# Patient Record
Sex: Female | Born: 1935 | Race: White | Hispanic: No | Marital: Single
Health system: Southern US, Community
[De-identification: ages and names within clinical notes are randomized; demographics above are authoritative.]

## PROBLEM LIST (undated history)

## (undated) DIAGNOSIS — G309 Alzheimer's disease, unspecified: Secondary | ICD-10-CM

## (undated) DIAGNOSIS — J189 Pneumonia, unspecified organism: Secondary | ICD-10-CM

## (undated) DIAGNOSIS — F410 Panic disorder [episodic paroxysmal anxiety] without agoraphobia: Secondary | ICD-10-CM

## (undated) DIAGNOSIS — I350 Nonrheumatic aortic (valve) stenosis: Secondary | ICD-10-CM

## (undated) DIAGNOSIS — F0280 Dementia in other diseases classified elsewhere without behavioral disturbance: Secondary | ICD-10-CM

---

## 2011-05-19 DIAGNOSIS — I10 Essential (primary) hypertension: Secondary | ICD-10-CM | POA: Diagnosis not present

## 2011-05-19 DIAGNOSIS — E785 Hyperlipidemia, unspecified: Secondary | ICD-10-CM | POA: Diagnosis not present

## 2011-05-23 DIAGNOSIS — K449 Diaphragmatic hernia without obstruction or gangrene: Secondary | ICD-10-CM | POA: Diagnosis not present

## 2011-05-23 DIAGNOSIS — R109 Unspecified abdominal pain: Secondary | ICD-10-CM | POA: Diagnosis not present

## 2011-05-23 DIAGNOSIS — M5430 Sciatica, unspecified side: Secondary | ICD-10-CM | POA: Diagnosis not present

## 2011-05-23 DIAGNOSIS — J189 Pneumonia, unspecified organism: Secondary | ICD-10-CM | POA: Diagnosis not present

## 2011-05-23 DIAGNOSIS — M5137 Other intervertebral disc degeneration, lumbosacral region: Secondary | ICD-10-CM | POA: Diagnosis not present

## 2011-05-23 DIAGNOSIS — R059 Cough, unspecified: Secondary | ICD-10-CM | POA: Diagnosis not present

## 2011-05-23 DIAGNOSIS — Z981 Arthrodesis status: Secondary | ICD-10-CM | POA: Diagnosis not present

## 2011-05-24 DIAGNOSIS — J189 Pneumonia, unspecified organism: Secondary | ICD-10-CM | POA: Diagnosis not present

## 2011-05-24 DIAGNOSIS — R05 Cough: Secondary | ICD-10-CM | POA: Diagnosis not present

## 2011-05-24 DIAGNOSIS — R109 Unspecified abdominal pain: Secondary | ICD-10-CM | POA: Diagnosis not present

## 2011-05-24 DIAGNOSIS — K449 Diaphragmatic hernia without obstruction or gangrene: Secondary | ICD-10-CM | POA: Diagnosis not present

## 2011-05-24 DIAGNOSIS — Q248 Other specified congenital malformations of heart: Secondary | ICD-10-CM | POA: Diagnosis not present

## 2011-05-25 DIAGNOSIS — J189 Pneumonia, unspecified organism: Secondary | ICD-10-CM | POA: Diagnosis not present

## 2011-05-25 DIAGNOSIS — K6289 Other specified diseases of anus and rectum: Secondary | ICD-10-CM | POA: Diagnosis not present

## 2011-05-25 DIAGNOSIS — K219 Gastro-esophageal reflux disease without esophagitis: Secondary | ICD-10-CM | POA: Diagnosis not present

## 2011-05-25 DIAGNOSIS — K5730 Diverticulosis of large intestine without perforation or abscess without bleeding: Secondary | ICD-10-CM | POA: Diagnosis not present

## 2011-05-25 DIAGNOSIS — R112 Nausea with vomiting, unspecified: Secondary | ICD-10-CM | POA: Diagnosis not present

## 2011-05-25 DIAGNOSIS — I499 Cardiac arrhythmia, unspecified: Secondary | ICD-10-CM | POA: Diagnosis not present

## 2011-05-25 DIAGNOSIS — R05 Cough: Secondary | ICD-10-CM | POA: Diagnosis not present

## 2011-05-25 DIAGNOSIS — M5430 Sciatica, unspecified side: Secondary | ICD-10-CM | POA: Diagnosis present

## 2011-05-25 DIAGNOSIS — R198 Other specified symptoms and signs involving the digestive system and abdomen: Secondary | ICD-10-CM | POA: Diagnosis not present

## 2011-05-25 DIAGNOSIS — K259 Gastric ulcer, unspecified as acute or chronic, without hemorrhage or perforation: Secondary | ICD-10-CM | POA: Diagnosis not present

## 2011-05-25 DIAGNOSIS — R1013 Epigastric pain: Secondary | ICD-10-CM | POA: Diagnosis not present

## 2011-05-25 DIAGNOSIS — I10 Essential (primary) hypertension: Secondary | ICD-10-CM | POA: Diagnosis not present

## 2011-05-25 DIAGNOSIS — K2980 Duodenitis without bleeding: Secondary | ICD-10-CM | POA: Diagnosis not present

## 2011-05-25 DIAGNOSIS — M1990 Unspecified osteoarthritis, unspecified site: Secondary | ICD-10-CM | POA: Diagnosis not present

## 2011-05-25 DIAGNOSIS — K449 Diaphragmatic hernia without obstruction or gangrene: Secondary | ICD-10-CM | POA: Diagnosis not present

## 2011-05-25 DIAGNOSIS — R109 Unspecified abdominal pain: Secondary | ICD-10-CM | POA: Diagnosis not present

## 2011-05-25 DIAGNOSIS — K253 Acute gastric ulcer without hemorrhage or perforation: Secondary | ICD-10-CM | POA: Diagnosis present

## 2011-05-25 DIAGNOSIS — Q248 Other specified congenital malformations of heart: Secondary | ICD-10-CM | POA: Diagnosis not present

## 2011-05-25 DIAGNOSIS — K269 Duodenal ulcer, unspecified as acute or chronic, without hemorrhage or perforation: Secondary | ICD-10-CM | POA: Diagnosis not present

## 2011-05-25 DIAGNOSIS — K5900 Constipation, unspecified: Secondary | ICD-10-CM | POA: Diagnosis not present

## 2011-05-25 DIAGNOSIS — R110 Nausea: Secondary | ICD-10-CM | POA: Diagnosis not present

## 2011-05-29 DIAGNOSIS — F329 Major depressive disorder, single episode, unspecified: Secondary | ICD-10-CM | POA: Diagnosis not present

## 2011-05-29 DIAGNOSIS — G252 Other specified forms of tremor: Secondary | ICD-10-CM | POA: Diagnosis not present

## 2011-05-29 DIAGNOSIS — M1991 Primary osteoarthritis, unspecified site: Secondary | ICD-10-CM | POA: Diagnosis not present

## 2011-05-29 DIAGNOSIS — R262 Difficulty in walking, not elsewhere classified: Secondary | ICD-10-CM | POA: Diagnosis not present

## 2011-05-29 DIAGNOSIS — K589 Irritable bowel syndrome without diarrhea: Secondary | ICD-10-CM | POA: Diagnosis not present

## 2011-05-29 DIAGNOSIS — G250 Essential tremor: Secondary | ICD-10-CM | POA: Diagnosis not present

## 2011-05-29 DIAGNOSIS — K5730 Diverticulosis of large intestine without perforation or abscess without bleeding: Secondary | ICD-10-CM | POA: Diagnosis not present

## 2011-05-31 DIAGNOSIS — R5381 Other malaise: Secondary | ICD-10-CM | POA: Diagnosis not present

## 2011-05-31 DIAGNOSIS — R262 Difficulty in walking, not elsewhere classified: Secondary | ICD-10-CM | POA: Diagnosis not present

## 2011-05-31 DIAGNOSIS — M6250 Muscle wasting and atrophy, not elsewhere classified, unspecified site: Secondary | ICD-10-CM | POA: Diagnosis not present

## 2011-05-31 DIAGNOSIS — I10 Essential (primary) hypertension: Secondary | ICD-10-CM | POA: Diagnosis not present

## 2011-05-31 DIAGNOSIS — M1990 Unspecified osteoarthritis, unspecified site: Secondary | ICD-10-CM | POA: Diagnosis not present

## 2011-05-31 DIAGNOSIS — F329 Major depressive disorder, single episode, unspecified: Secondary | ICD-10-CM | POA: Diagnosis not present

## 2011-05-31 DIAGNOSIS — I459 Conduction disorder, unspecified: Secondary | ICD-10-CM | POA: Diagnosis not present

## 2011-05-31 DIAGNOSIS — R627 Adult failure to thrive: Secondary | ICD-10-CM | POA: Diagnosis not present

## 2011-05-31 DIAGNOSIS — M6281 Muscle weakness (generalized): Secondary | ICD-10-CM | POA: Diagnosis not present

## 2011-05-31 DIAGNOSIS — K5730 Diverticulosis of large intestine without perforation or abscess without bleeding: Secondary | ICD-10-CM | POA: Diagnosis not present

## 2011-05-31 DIAGNOSIS — Z5189 Encounter for other specified aftercare: Secondary | ICD-10-CM | POA: Diagnosis not present

## 2011-05-31 DIAGNOSIS — F411 Generalized anxiety disorder: Secondary | ICD-10-CM | POA: Diagnosis not present

## 2011-06-03 DIAGNOSIS — R5381 Other malaise: Secondary | ICD-10-CM | POA: Diagnosis not present

## 2011-06-03 DIAGNOSIS — I10 Essential (primary) hypertension: Secondary | ICD-10-CM | POA: Diagnosis not present

## 2011-06-17 DIAGNOSIS — G609 Hereditary and idiopathic neuropathy, unspecified: Secondary | ICD-10-CM | POA: Diagnosis not present

## 2011-06-17 DIAGNOSIS — E785 Hyperlipidemia, unspecified: Secondary | ICD-10-CM | POA: Diagnosis not present

## 2011-06-17 DIAGNOSIS — I10 Essential (primary) hypertension: Secondary | ICD-10-CM | POA: Diagnosis not present

## 2011-07-09 DIAGNOSIS — E785 Hyperlipidemia, unspecified: Secondary | ICD-10-CM | POA: Diagnosis not present

## 2011-07-09 DIAGNOSIS — I10 Essential (primary) hypertension: Secondary | ICD-10-CM | POA: Diagnosis not present

## 2011-07-09 DIAGNOSIS — D518 Other vitamin B12 deficiency anemias: Secondary | ICD-10-CM | POA: Diagnosis not present

## 2011-07-16 DIAGNOSIS — I10 Essential (primary) hypertension: Secondary | ICD-10-CM | POA: Diagnosis not present

## 2011-07-16 DIAGNOSIS — R42 Dizziness and giddiness: Secondary | ICD-10-CM | POA: Diagnosis not present

## 2011-08-13 DIAGNOSIS — R42 Dizziness and giddiness: Secondary | ICD-10-CM | POA: Diagnosis not present

## 2011-08-13 DIAGNOSIS — I10 Essential (primary) hypertension: Secondary | ICD-10-CM | POA: Diagnosis not present

## 2011-08-18 DIAGNOSIS — R3129 Other microscopic hematuria: Secondary | ICD-10-CM | POA: Diagnosis not present

## 2011-08-21 DIAGNOSIS — M25459 Effusion, unspecified hip: Secondary | ICD-10-CM | POA: Diagnosis not present

## 2011-08-21 DIAGNOSIS — R1909 Other intra-abdominal and pelvic swelling, mass and lump: Secondary | ICD-10-CM | POA: Diagnosis not present

## 2011-08-21 DIAGNOSIS — M7989 Other specified soft tissue disorders: Secondary | ICD-10-CM | POA: Diagnosis not present

## 2011-09-21 DIAGNOSIS — F321 Major depressive disorder, single episode, moderate: Secondary | ICD-10-CM | POA: Diagnosis not present

## 2011-11-02 DIAGNOSIS — I10 Essential (primary) hypertension: Secondary | ICD-10-CM | POA: Diagnosis not present

## 2011-11-02 DIAGNOSIS — E785 Hyperlipidemia, unspecified: Secondary | ICD-10-CM | POA: Diagnosis not present

## 2011-11-02 DIAGNOSIS — D518 Other vitamin B12 deficiency anemias: Secondary | ICD-10-CM | POA: Diagnosis not present

## 2011-11-10 DIAGNOSIS — I10 Essential (primary) hypertension: Secondary | ICD-10-CM | POA: Diagnosis not present

## 2011-12-03 DIAGNOSIS — H532 Diplopia: Secondary | ICD-10-CM | POA: Diagnosis not present

## 2011-12-21 DIAGNOSIS — H532 Diplopia: Secondary | ICD-10-CM | POA: Diagnosis not present

## 2011-12-21 DIAGNOSIS — H5200 Hypermetropia, unspecified eye: Secondary | ICD-10-CM | POA: Diagnosis not present

## 2012-02-02 DIAGNOSIS — I10 Essential (primary) hypertension: Secondary | ICD-10-CM | POA: Diagnosis not present

## 2012-02-09 DIAGNOSIS — I10 Essential (primary) hypertension: Secondary | ICD-10-CM | POA: Diagnosis not present

## 2012-04-12 DIAGNOSIS — H905 Unspecified sensorineural hearing loss: Secondary | ICD-10-CM | POA: Diagnosis not present

## 2012-04-22 DIAGNOSIS — I517 Cardiomegaly: Secondary | ICD-10-CM | POA: Diagnosis not present

## 2012-04-22 DIAGNOSIS — R0789 Other chest pain: Secondary | ICD-10-CM | POA: Diagnosis not present

## 2012-04-22 DIAGNOSIS — K5732 Diverticulitis of large intestine without perforation or abscess without bleeding: Secondary | ICD-10-CM | POA: Diagnosis not present

## 2012-04-22 DIAGNOSIS — R1013 Epigastric pain: Secondary | ICD-10-CM | POA: Diagnosis not present

## 2012-04-22 DIAGNOSIS — R9431 Abnormal electrocardiogram [ECG] [EKG]: Secondary | ICD-10-CM | POA: Diagnosis not present

## 2012-04-22 DIAGNOSIS — F0390 Unspecified dementia without behavioral disturbance: Secondary | ICD-10-CM | POA: Diagnosis not present

## 2012-09-23 DIAGNOSIS — Z23 Encounter for immunization: Secondary | ICD-10-CM | POA: Diagnosis not present

## 2012-09-23 DIAGNOSIS — I10 Essential (primary) hypertension: Secondary | ICD-10-CM | POA: Diagnosis not present

## 2013-10-11 DIAGNOSIS — E785 Hyperlipidemia, unspecified: Secondary | ICD-10-CM | POA: Diagnosis not present

## 2013-10-11 DIAGNOSIS — H6120 Impacted cerumen, unspecified ear: Secondary | ICD-10-CM | POA: Diagnosis not present

## 2013-10-11 DIAGNOSIS — G309 Alzheimer's disease, unspecified: Secondary | ICD-10-CM | POA: Diagnosis not present

## 2013-10-11 DIAGNOSIS — F0280 Dementia in other diseases classified elsewhere without behavioral disturbance: Secondary | ICD-10-CM | POA: Diagnosis not present

## 2013-10-11 DIAGNOSIS — I10 Essential (primary) hypertension: Secondary | ICD-10-CM | POA: Diagnosis not present

## 2013-12-13 DIAGNOSIS — M549 Dorsalgia, unspecified: Secondary | ICD-10-CM | POA: Diagnosis not present

## 2013-12-13 DIAGNOSIS — G309 Alzheimer's disease, unspecified: Secondary | ICD-10-CM | POA: Diagnosis not present

## 2013-12-13 DIAGNOSIS — I10 Essential (primary) hypertension: Secondary | ICD-10-CM | POA: Diagnosis not present

## 2014-01-23 DIAGNOSIS — I10 Essential (primary) hypertension: Secondary | ICD-10-CM | POA: Diagnosis not present

## 2014-01-23 DIAGNOSIS — M549 Dorsalgia, unspecified: Secondary | ICD-10-CM | POA: Diagnosis not present

## 2014-01-23 DIAGNOSIS — G309 Alzheimer's disease, unspecified: Secondary | ICD-10-CM | POA: Diagnosis not present

## 2014-03-26 DIAGNOSIS — G309 Alzheimer's disease, unspecified: Secondary | ICD-10-CM | POA: Diagnosis not present

## 2014-03-26 DIAGNOSIS — R269 Unspecified abnormalities of gait and mobility: Secondary | ICD-10-CM | POA: Diagnosis not present

## 2014-03-26 DIAGNOSIS — I10 Essential (primary) hypertension: Secondary | ICD-10-CM | POA: Diagnosis not present

## 2014-03-26 DIAGNOSIS — M79604 Pain in right leg: Secondary | ICD-10-CM | POA: Diagnosis not present

## 2014-04-02 DIAGNOSIS — F0280 Dementia in other diseases classified elsewhere without behavioral disturbance: Secondary | ICD-10-CM | POA: Diagnosis not present

## 2014-04-02 DIAGNOSIS — M6281 Muscle weakness (generalized): Secondary | ICD-10-CM | POA: Diagnosis not present

## 2014-04-02 DIAGNOSIS — F329 Major depressive disorder, single episode, unspecified: Secondary | ICD-10-CM | POA: Diagnosis not present

## 2014-04-02 DIAGNOSIS — Z9181 History of falling: Secondary | ICD-10-CM | POA: Diagnosis not present

## 2014-04-02 DIAGNOSIS — M1990 Unspecified osteoarthritis, unspecified site: Secondary | ICD-10-CM | POA: Diagnosis not present

## 2014-04-02 DIAGNOSIS — G309 Alzheimer's disease, unspecified: Secondary | ICD-10-CM | POA: Diagnosis not present

## 2014-04-02 DIAGNOSIS — I10 Essential (primary) hypertension: Secondary | ICD-10-CM | POA: Diagnosis not present

## 2014-04-13 DIAGNOSIS — F0280 Dementia in other diseases classified elsewhere without behavioral disturbance: Secondary | ICD-10-CM | POA: Diagnosis not present

## 2014-04-13 DIAGNOSIS — G309 Alzheimer's disease, unspecified: Secondary | ICD-10-CM | POA: Diagnosis not present

## 2014-04-13 DIAGNOSIS — M6281 Muscle weakness (generalized): Secondary | ICD-10-CM | POA: Diagnosis not present

## 2014-04-13 DIAGNOSIS — I10 Essential (primary) hypertension: Secondary | ICD-10-CM | POA: Diagnosis not present

## 2014-04-13 DIAGNOSIS — F329 Major depressive disorder, single episode, unspecified: Secondary | ICD-10-CM | POA: Diagnosis not present

## 2014-04-13 DIAGNOSIS — M1990 Unspecified osteoarthritis, unspecified site: Secondary | ICD-10-CM | POA: Diagnosis not present

## 2014-04-18 DIAGNOSIS — F329 Major depressive disorder, single episode, unspecified: Secondary | ICD-10-CM | POA: Diagnosis not present

## 2014-04-18 DIAGNOSIS — G309 Alzheimer's disease, unspecified: Secondary | ICD-10-CM | POA: Diagnosis not present

## 2014-04-18 DIAGNOSIS — M1990 Unspecified osteoarthritis, unspecified site: Secondary | ICD-10-CM | POA: Diagnosis not present

## 2014-04-18 DIAGNOSIS — M6281 Muscle weakness (generalized): Secondary | ICD-10-CM | POA: Diagnosis not present

## 2014-04-18 DIAGNOSIS — I10 Essential (primary) hypertension: Secondary | ICD-10-CM | POA: Diagnosis not present

## 2014-04-18 DIAGNOSIS — F0280 Dementia in other diseases classified elsewhere without behavioral disturbance: Secondary | ICD-10-CM | POA: Diagnosis not present

## 2014-04-19 DIAGNOSIS — F329 Major depressive disorder, single episode, unspecified: Secondary | ICD-10-CM | POA: Diagnosis not present

## 2014-04-19 DIAGNOSIS — F0280 Dementia in other diseases classified elsewhere without behavioral disturbance: Secondary | ICD-10-CM | POA: Diagnosis not present

## 2014-04-19 DIAGNOSIS — I10 Essential (primary) hypertension: Secondary | ICD-10-CM | POA: Diagnosis not present

## 2014-04-19 DIAGNOSIS — M1990 Unspecified osteoarthritis, unspecified site: Secondary | ICD-10-CM | POA: Diagnosis not present

## 2014-04-19 DIAGNOSIS — G309 Alzheimer's disease, unspecified: Secondary | ICD-10-CM | POA: Diagnosis not present

## 2014-04-19 DIAGNOSIS — M6281 Muscle weakness (generalized): Secondary | ICD-10-CM | POA: Diagnosis not present

## 2014-04-25 DIAGNOSIS — M1990 Unspecified osteoarthritis, unspecified site: Secondary | ICD-10-CM | POA: Diagnosis not present

## 2014-04-25 DIAGNOSIS — F329 Major depressive disorder, single episode, unspecified: Secondary | ICD-10-CM | POA: Diagnosis not present

## 2014-04-25 DIAGNOSIS — F0280 Dementia in other diseases classified elsewhere without behavioral disturbance: Secondary | ICD-10-CM | POA: Diagnosis not present

## 2014-04-25 DIAGNOSIS — M6281 Muscle weakness (generalized): Secondary | ICD-10-CM | POA: Diagnosis not present

## 2014-04-25 DIAGNOSIS — G309 Alzheimer's disease, unspecified: Secondary | ICD-10-CM | POA: Diagnosis not present

## 2014-04-25 DIAGNOSIS — I10 Essential (primary) hypertension: Secondary | ICD-10-CM | POA: Diagnosis not present

## 2014-04-26 DIAGNOSIS — F0280 Dementia in other diseases classified elsewhere without behavioral disturbance: Secondary | ICD-10-CM | POA: Diagnosis not present

## 2014-04-26 DIAGNOSIS — I10 Essential (primary) hypertension: Secondary | ICD-10-CM | POA: Diagnosis not present

## 2014-04-26 DIAGNOSIS — G309 Alzheimer's disease, unspecified: Secondary | ICD-10-CM | POA: Diagnosis not present

## 2014-04-26 DIAGNOSIS — M1990 Unspecified osteoarthritis, unspecified site: Secondary | ICD-10-CM | POA: Diagnosis not present

## 2014-04-26 DIAGNOSIS — M6281 Muscle weakness (generalized): Secondary | ICD-10-CM | POA: Diagnosis not present

## 2014-04-26 DIAGNOSIS — F329 Major depressive disorder, single episode, unspecified: Secondary | ICD-10-CM | POA: Diagnosis not present

## 2014-04-27 DIAGNOSIS — M5417 Radiculopathy, lumbosacral region: Secondary | ICD-10-CM | POA: Diagnosis not present

## 2014-05-09 DIAGNOSIS — M5416 Radiculopathy, lumbar region: Secondary | ICD-10-CM | POA: Diagnosis not present

## 2014-05-09 DIAGNOSIS — M4806 Spinal stenosis, lumbar region: Secondary | ICD-10-CM | POA: Diagnosis not present

## 2014-05-09 DIAGNOSIS — M4807 Spinal stenosis, lumbosacral region: Secondary | ICD-10-CM | POA: Diagnosis not present

## 2014-05-09 DIAGNOSIS — M5127 Other intervertebral disc displacement, lumbosacral region: Secondary | ICD-10-CM | POA: Diagnosis not present

## 2014-05-09 DIAGNOSIS — M5126 Other intervertebral disc displacement, lumbar region: Secondary | ICD-10-CM | POA: Diagnosis not present

## 2014-05-09 DIAGNOSIS — Z981 Arthrodesis status: Secondary | ICD-10-CM | POA: Diagnosis not present

## 2014-05-15 DIAGNOSIS — M5417 Radiculopathy, lumbosacral region: Secondary | ICD-10-CM | POA: Diagnosis not present

## 2015-02-05 DIAGNOSIS — E785 Hyperlipidemia, unspecified: Secondary | ICD-10-CM | POA: Diagnosis not present

## 2015-02-05 DIAGNOSIS — F419 Anxiety disorder, unspecified: Secondary | ICD-10-CM | POA: Diagnosis not present

## 2015-02-05 DIAGNOSIS — I10 Essential (primary) hypertension: Secondary | ICD-10-CM | POA: Diagnosis not present

## 2015-02-05 DIAGNOSIS — G309 Alzheimer's disease, unspecified: Secondary | ICD-10-CM | POA: Diagnosis not present

## 2016-01-14 ENCOUNTER — Observation Stay (HOSPITAL_COMMUNITY)
Admission: EM | Admit: 2016-01-14 | Discharge: 2016-01-16 | Disposition: A | Discharge status: Skilled Nursing Facility | Payer: Medicare Other | Attending: Family Medicine | Admitting: Family Medicine

## 2016-01-14 ENCOUNTER — Emergency Department (HOSPITAL_COMMUNITY): Payer: Medicare Other

## 2016-01-14 ENCOUNTER — Encounter (HOSPITAL_COMMUNITY): Payer: Self-pay | Admitting: General Practice

## 2016-01-14 DIAGNOSIS — S0083XD Contusion of other part of head, subsequent encounter: Secondary | ICD-10-CM | POA: Diagnosis not present

## 2016-01-14 DIAGNOSIS — E872 Acidosis, unspecified: Secondary | ICD-10-CM

## 2016-01-14 DIAGNOSIS — R4182 Altered mental status, unspecified: Secondary | ICD-10-CM | POA: Diagnosis present

## 2016-01-14 DIAGNOSIS — R410 Disorientation, unspecified: Secondary | ICD-10-CM | POA: Diagnosis not present

## 2016-01-14 DIAGNOSIS — R269 Unspecified abnormalities of gait and mobility: Secondary | ICD-10-CM | POA: Diagnosis not present

## 2016-01-14 DIAGNOSIS — J181 Lobar pneumonia, unspecified organism: Secondary | ICD-10-CM

## 2016-01-14 DIAGNOSIS — S0083XA Contusion of other part of head, initial encounter: Secondary | ICD-10-CM | POA: Diagnosis not present

## 2016-01-14 DIAGNOSIS — X58XXXA Exposure to other specified factors, initial encounter: Secondary | ICD-10-CM | POA: Diagnosis not present

## 2016-01-14 DIAGNOSIS — J189 Pneumonia, unspecified organism: Secondary | ICD-10-CM | POA: Diagnosis not present

## 2016-01-14 DIAGNOSIS — F419 Anxiety disorder, unspecified: Secondary | ICD-10-CM

## 2016-01-14 DIAGNOSIS — M6281 Muscle weakness (generalized): Secondary | ICD-10-CM

## 2016-01-14 HISTORY — DX: Pneumonia, unspecified organism: J18.9

## 2016-01-14 HISTORY — DX: Panic disorder (episodic paroxysmal anxiety): F41.0

## 2016-01-14 HISTORY — DX: Alzheimer's disease, unspecified: G30.9

## 2016-01-14 HISTORY — DX: Nonrheumatic aortic (valve) stenosis: I35.0

## 2016-01-14 HISTORY — DX: Dementia in other diseases classified elsewhere, unspecified severity, without behavioral disturbance, psychotic disturbance, mood disturbance, and anxiety: F02.80

## 2016-01-14 LAB — I-STAT CG4 LACTIC ACID, ED
LACTIC ACID, VENOUS: 4.86 mmol/L — AB (ref 0.5–1.9)
Lactic Acid, Venous: 3.26 mmol/L (ref 0.5–1.9)

## 2016-01-14 LAB — I-STAT TROPONIN, ED: TROPONIN I, POC: 0.04 ng/mL (ref 0.00–0.08)

## 2016-01-14 LAB — COMPREHENSIVE METABOLIC PANEL
ALBUMIN: 4 g/dL (ref 3.5–5.0)
ALT: 13 U/L — ABNORMAL LOW (ref 14–54)
ANION GAP: 14 (ref 5–15)
AST: 31 U/L (ref 15–41)
Alkaline Phosphatase: 49 U/L (ref 38–126)
BILIRUBIN TOTAL: 0.5 mg/dL (ref 0.3–1.2)
BUN: 20 mg/dL (ref 6–20)
CO2: 17 mmol/L — ABNORMAL LOW (ref 22–32)
Calcium: 9.3 mg/dL (ref 8.9–10.3)
Chloride: 109 mmol/L (ref 101–111)
Creatinine, Ser: 0.9 mg/dL (ref 0.44–1.00)
GFR calc Af Amer: >60 mL/min (ref >60)
GFR, EST NON AFRICAN AMERICAN: 53 mL/min — AB (ref >60)
GLUCOSE: 82 mg/dL (ref 65–99)
POTASSIUM: 4.1 mmol/L (ref 3.5–5.1)
Sodium: 140 mmol/L (ref 135–145)
TOTAL PROTEIN: 6.7 g/dL (ref 6.5–8.1)

## 2016-01-14 LAB — CBC WITH DIFFERENTIAL/PLATELET
BASOS PCT: 0 %
Basophils Absolute: 0 10*3/uL (ref 0.0–0.1)
EOS ABS: 0 10*3/uL (ref 0.0–0.7)
EOS PCT: 0 %
HEMATOCRIT: 36.8 % (ref 36.0–46.0)
Hemoglobin: 12.1 g/dL (ref 12.0–15.0)
Lymphocytes Relative: 14 %
Lymphs Abs: 1.3 10*3/uL (ref 0.7–4.0)
MCH: 28.7 pg (ref 26.0–34.0)
MCHC: 32.9 g/dL (ref 30.0–36.0)
MCV: 87.4 fL (ref 78.0–100.0)
MONO ABS: 1.1 10*3/uL — AB (ref 0.1–1.0)
MONOS PCT: 12 %
NEUTROS ABS: 6.6 10*3/uL (ref 1.7–7.7)
Neutrophils Relative %: 74 %
Platelets: 266 10*3/uL (ref 150–400)
RBC: 4.21 MIL/uL (ref 3.87–5.11)
RDW: 14.8 % (ref 11.5–15.5)
WBC: 9 10*3/uL (ref 4.0–10.5)

## 2016-01-14 LAB — URINALYSIS, ROUTINE W REFLEX MICROSCOPIC
BILIRUBIN URINE: NEGATIVE
GLUCOSE, UA: NEGATIVE mg/dL
KETONES UR: NEGATIVE mg/dL
Leukocytes, UA: NEGATIVE
Nitrite: NEGATIVE
PROTEIN: 30 mg/dL — AB
Specific Gravity, Urine: 1.027 (ref 1.005–1.030)
pH: 5.5 (ref 5.0–8.0)

## 2016-01-14 LAB — PROTIME-INR
INR: 1
PROTHROMBIN TIME: 13.2 s (ref 11.4–15.2)

## 2016-01-14 LAB — URINE MICROSCOPIC-ADD ON

## 2016-01-14 MED ORDER — DEXTROSE 5 % IV SOLN
1.0000 g | Freq: Once | INTRAVENOUS | Status: AC
  Administered 2016-01-14: 1 g via INTRAVENOUS
  Filled 2016-01-14: qty 10

## 2016-01-14 MED ORDER — ACETAMINOPHEN 650 MG RE SUPP: 650.0000 mg | Freq: Four times a day (QID) | RECTAL | Status: DC | PRN

## 2016-01-14 MED ORDER — ACETAMINOPHEN 325 MG PO TABS: 650.0000 mg | ORAL_TABLET | Freq: Four times a day (QID) | ORAL | Status: DC | PRN

## 2016-01-14 MED ORDER — POLYETHYLENE GLYCOL 3350 17 G PO PACK: 17.0000 g | PACK | Freq: Every day | ORAL | Status: DC | PRN

## 2016-01-14 MED ORDER — DEXTROSE 5 % IV SOLN
500.0000 mg | Freq: Once | INTRAVENOUS | Status: DC
  Administered 2016-01-14: 500 mg via INTRAVENOUS
  Filled 2016-01-14 (×2): qty 500

## 2016-01-14 MED ORDER — SODIUM CHLORIDE 0.9 % IV BOLUS (SEPSIS)
1000.0000 mL | Freq: Once | INTRAVENOUS | Status: AC
  Administered 2016-01-14: 1000 mL via INTRAVENOUS

## 2016-01-14 MED ORDER — ENOXAPARIN SODIUM 40 MG/0.4ML ~~LOC~~ SOLN
40.0000 mg | SUBCUTANEOUS | Status: DC
  Administered 2016-01-14 – 2016-01-16 (×2): 40 mg via SUBCUTANEOUS
  Filled 2016-01-14 (×2): qty 0.4

## 2016-01-14 MED ORDER — LORAZEPAM 2 MG/ML IJ SOLN
0.5000 mg | Freq: Once | INTRAMUSCULAR | Status: AC
  Administered 2016-01-14: 0.5 mg via INTRAVENOUS
  Filled 2016-01-14: qty 1

## 2016-01-14 MED ORDER — SODIUM CHLORIDE 0.9 % IV SOLN
INTRAVENOUS | Status: DC
  Administered 2016-01-14 (×2): via INTRAVENOUS
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
## 2016-01-14 NOTE — ED Notes (Signed)
Patient transported to CT 

## 2016-01-14 NOTE — Progress Notes (Signed)
Seen and examined.  Discussed with residents.  Will co sign their note and include my thoughts when it is available.   Big problem for [place] is lack of reliable information.  Apparently dropped off by caregiver who stated that patient had a fall and was not acting her normal self.  Caregiver left and we have been unable to contact.  We are left guessing on this pleasantly demented patient.  Fortunately, the patient has a reassuring general appearance without tachycardia or tachypnea.  The sparse amount of information in care everywhere includes an echo with severe aortic stenosis.  I favor a very non invasive approach with monitoring and due diligence repeated attempts to contact the caregiver.

## 2016-01-14 NOTE — ED Notes (Signed)
Pt being taken to Xray
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
## 2016-01-14 NOTE — ED Triage Notes (Signed)
Per EMS - pt had incontinent episode at home, tried to get into shower and fell. No LOC and did not hit head. Hx Parkinsons, dementia, panic attacks.

## 2016-01-15 DIAGNOSIS — S0083XA Contusion of other part of head, initial encounter: Secondary | ICD-10-CM

## 2016-01-15 DIAGNOSIS — R269 Unspecified abnormalities of gait and mobility: Secondary | ICD-10-CM | POA: Diagnosis not present

## 2016-01-15 DIAGNOSIS — E872 Acidosis, unspecified: Secondary | ICD-10-CM

## 2016-01-15 DIAGNOSIS — R410 Disorientation, unspecified: Secondary | ICD-10-CM | POA: Diagnosis not present

## 2016-01-15 LAB — CBC
HEMATOCRIT: 35.3 % — AB (ref 36.0–46.0)
HEMOGLOBIN: 11.2 g/dL — AB (ref 12.0–15.0)
MCH: 28.4 pg (ref 26.0–34.0)
MCHC: 31.7 g/dL (ref 30.0–36.0)
MCV: 89.4 fL (ref 78.0–100.0)
Platelets: 252 10*3/uL (ref 150–400)
RBC: 3.95 MIL/uL (ref 3.87–5.11)
RDW: 14.9 % (ref 11.5–15.5)
WBC: 8 10*3/uL (ref 4.0–10.5)

## 2016-01-15 LAB — BASIC METABOLIC PANEL
Anion gap: 9 (ref 5–15)
BUN: 11 mg/dL (ref 6–20)
CALCIUM: 8.4 mg/dL — AB (ref 8.9–10.3)
CHLORIDE: 110 mmol/L (ref 101–111)
CO2: 23 mmol/L (ref 22–32)
CREATININE: 0.68 mg/dL (ref 0.44–1.00)
GFR calc non Af Amer: >60 mL/min (ref >60)
GLUCOSE: 102 mg/dL — AB (ref 65–99)
Potassium: 3.8 mmol/L (ref 3.5–5.1)
Sodium: 142 mmol/L (ref 135–145)

## 2016-01-15 LAB — LACTIC ACID, PLASMA: Lactic Acid, Venous: 1 mmol/L (ref 0.5–1.9)

## 2016-01-15 MED ORDER — QUETIAPINE FUMARATE 25 MG PO TABS
25.0000 mg | ORAL_TABLET | Freq: Every day | ORAL | Status: DC
Start: 2016-01-15 — End: 2016-01-16
  Administered 2016-01-15 – 2016-01-16 (×2): 25 mg via ORAL
  Filled 2016-01-15 (×2): qty 1

## 2016-01-15 MED ORDER — TRAZODONE HCL 50 MG PO TABS
50.0000 mg | ORAL_TABLET | Freq: Every evening | ORAL | Status: DC | PRN
  Administered 2016-01-16: 50 mg via ORAL
  Filled 2016-01-15: qty 1

## 2016-01-15 MED ORDER — HALOPERIDOL LACTATE 5 MG/ML IJ SOLN
1.0000 mg | Freq: Once | INTRAMUSCULAR | Status: AC
  Administered 2016-01-15: 1 mg via INTRAVENOUS
  Filled 2016-01-15: qty 1

## 2016-01-15 MED ORDER — WHITE PETROLATUM GEL
Status: AC
  Administered 2016-01-15: 08:00:00
  Filled 2016-01-15: qty 1

## 2016-01-15 MED ORDER — LORAZEPAM 2 MG/ML IJ SOLN
0.5000 mg | Freq: Once | INTRAMUSCULAR | Status: AC
  Administered 2016-01-15: 0.5 mg via INTRAVENOUS
  Filled 2016-01-15: qty 1

## 2016-01-15 MED ORDER — ASPIRIN EC 81 MG PO TBEC
81.0000 mg | DELAYED_RELEASE_TABLET | Freq: Every day | ORAL | Status: DC
  Administered 2016-01-15 – 2016-01-16 (×2): 81 mg via ORAL
  Filled 2016-01-15 (×2): qty 1

## 2016-01-15 NOTE — Progress Notes (Signed)
CSW acknowledges consults- no numbers documented for granddaughter but RN states that she was told in report that granddaughter plans to come to the hospital today- CSW will speak with granddaughter at that time.  CSW will continue to follow and will assess for APS report if appropriate after speaking with granddaughter.  Jorge Ny, LCSW Clinical Social Worker [phone]
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
## 2016-01-15 NOTE — Progress Notes (Signed)
Reviewed CC44 over the phone with next of kin, granddaughter [name] at [phone]. [name] unwilling at this time to confirm understanding of ABN, and would like to speak with case manager tomorrow. She states she will not be available early in the morning, has court at 8:30. [name] provided with Whitman Hero RN CM's phone number to contact tomorrow to review ABN for extended observation stay.

## 2016-01-15 NOTE — Progress Notes (Signed)
Patient became increasingly confused, fulling at IV and climbing out of bed.  MD notified and a new order received.

## 2016-01-15 NOTE — Progress Notes (Signed)
Patient slept some; she is awake now constantly getting out of bed wan[ing to go the the bathroom but when I take her she is not using it.  IV pulled out as well.  MD notified.

## 2016-01-15 NOTE — Progress Notes (Signed)
Patient granddaughter, Lattie Haw, called and expressed her concern about pt.  She stated that she is no longer able to take care of patient. She further stated that it was difficult to help her grandmother off the floor when she fell and that she attempted to climb out through the window.  She stated that that was very scary. She is requesting for placement for her on D/C.

## 2016-01-15 NOTE — Evaluation (Signed)
Occupational Therapy Evaluation Patient Details Name: Lisa Grant MRN: [id number] DOB: Jul 13, 1920 Today's Date: 01/15/2016    History of Present Illness Gwena Mulrooney is a 82 y.o. female presenting with altered mental status and elevated lactate. PMH significant for dementia but overall unknown.    Clinical Impression   Pt unable to provide PLOF information and no family present. Currently pt requires min assist overall for ADL and functional mobility due to poor balance and impulsivity. Recommending SNF for follow up to maximize independence and safety with ADL and functional mobility prior to return home. Pt would benefit from continued skilled OT to address established goals.    Follow Up Recommendations  SNF;Supervision/Assistance - 24 hour    Equipment Recommendations  Other (comment) (TBD at next venue)    Recommendations for Other Services       Precautions / Restrictions Precautions Precautions: Fall Restrictions Weight Bearing Restrictions: No      Mobility Bed Mobility Overal bed mobility: Needs Assistance Bed Mobility: Supine to Sit;Sit to Supine     Supine to sit: Supervision Sit to supine: Supervision   General bed mobility comments: Supervision for safety. Pt attempting to get OOB upon OT arrival.  Transfers Overall transfer level: Needs assistance Equipment used: Rolling walker (2 wheeled) Transfers: Sit to/from Stand Sit to Stand: Min assist         General transfer comment: Min assist for balance.    Balance Overall balance assessment: Needs assistance Sitting-balance support: Feet supported;No upper extremity supported Sitting balance-Leahy Scale: Good     Standing balance support: No upper extremity supported;During functional activity Standing balance-Leahy Scale: Poor Standing balance comment: Min assist for standing balance at sink to wash hands.                            ADL Overall ADL's : Needs  assistance/impaired     Grooming: Minimal assistance;Standing;Wash/dry hands   Upper Body Bathing: Min guard;Sitting   Lower Body Bathing: Minimal assistance;Sit to/from stand   Upper Body Dressing : Min guard;Sitting   Lower Body Dressing: Minimal assistance;Sit to/from stand   Toilet Transfer: Minimal assistance;Ambulation;Regular Toilet;RW;Grab bars   Toileting- Clothing Manipulation and Hygiene: Minimal assistance;Sit to/from stand       Functional mobility during ADLs: Minimal assistance;Rolling walker General ADL Comments: Pt attempting to get OOB upon OT arrival; demonstrates decreased safety awareness throughout OT eval. Pt noted to have bil UE shaking throughout session.     Vision Additional Comments: Difficult to assess due to impaired cognition.   Perception     Praxis      Pertinent Vitals/Pain Pain Assessment: No/denies pain     Hand Dominance     Extremity/Trunk Assessment Upper Extremity Assessment Upper Extremity Assessment: Generalized weakness   Lower Extremity Assessment Lower Extremity Assessment: Defer to PT evaluation   Cervical / Trunk Assessment Cervical / Trunk Assessment: Kyphotic   Communication Communication Communication: No difficulties   Cognition Arousal/Alertness: Awake/alert Behavior During Therapy: Flat affect Overall Cognitive Status: No family/caregiver present to determine baseline cognitive functioning                     General Comments       Exercises       Shoulder Instructions      Home Living Family/patient expects to be discharged to:: Skilled nursing facility  Prior Functioning/Environment          Comments: Pt unable to provide PLOF, no family present to provide.        OT Problem List: Decreased strength;Decreased activity tolerance;Impaired balance (sitting and/or standing);Decreased cognition;Decreased safety awareness;Decreased  knowledge of use of DME or AE   OT Treatment/Interventions: Self-care/ADL training;Therapeutic exercise;Energy conservation;DME and/or AE instruction;Therapeutic activities;Patient/family education;Balance training    OT Goals(Current goals can be found in the care plan section) Acute Rehab OT Goals Patient Stated Goal: None stated OT Goal Formulation: With patient Time For Goal Achievement: 01/29/16 Potential to Achieve Goals: Fair ADL Goals Pt Will Perform Grooming: with supervision;standing Pt Will Perform Upper Body Bathing: with supervision;sitting Pt Will Perform Lower Body Bathing: with supervision;sit to/from stand Pt Will Transfer to Toilet: with supervision;regular height toilet;ambulating Pt Will Perform Toileting - Clothing Manipulation and hygiene: with supervision;sit to/from stand  OT Frequency: Min 2X/week   Barriers to D/C:            Co-evaluation              End of Session Equipment Utilized During Treatment: Rolling walker;Gait belt Nurse Communication: Mobility status  Activity Tolerance: Patient tolerated treatment well Patient left: in bed;with call bell/phone within reach;with bed alarm set   Time: [id number] OT Time Calculation (min): 12 min Charges:  OT General Charges $OT Visit: 1 Procedure OT Evaluation $OT Eval Moderate Complexity: 1 Procedure G-Codes: OT G-codes **NOT FOR INPATIENT CLASS** Functional Assessment Tool Used: Clinical judgement Functional Limitation: Self care Self Care Current Status [id number]): At least 20 percent but less than 40 percent impaired, limited or restricted Self Care Goal Status [id number]): At least 1 percent but less than 20 percent impaired, limited or restricted   Binnie Kand M.S., OTR/L Pager: [phone]  01/15/2016, 5:38 PM

## 2016-01-15 NOTE — Care Management CC44 (Signed)
Condition Code 44 Documentation Completed  Patient Details  Name: Lisa Grant MRN: [id number] Date of Birth: March 04, 1921   Condition Code 44 given:  Yes Patient signature on Condition Code 44 notice:  Yes Documentation of 2 MD's agreement:  Yes Code 44 added to claim:  Yes    Carles Collet, RN 01/15/2016, 3:19 PM

## 2016-01-15 NOTE — NC FL2 (Signed)
[place] MEDICAID FL2 LEVEL OF CARE SCREENING TOOL     IDENTIFICATION  Patient Name: Lisa Grant Birthdate: Aug 17, 1920 Sex: female Admission Date (Current Location): 01/14/2016  [facility] and [place] Number:  Herbalist and Address:  [facility], [place] [address] [place], [place] [address]      Provider Number: [id number]  Attending Physician Name and Address:  Zenia Resides, MD  Relative Name and Phone Number:       Current Level of Care: Hospital Recommended Level of Care: [place] Prior Approval Number:    Date Approved/Denied:   PASRR Number: [id number]  Discharge Plan: SNF    Current Diagnoses: Patient Active Problem List   Diagnosis Date Noted  . Acute delirium   . Contusion of face   . Lactic acidosis   . Community acquired pneumonia of right middle lobe of lung ([place]) 01/14/2016  . Altered mental status 01/14/2016    Orientation RESPIRATION BLADDER Height & Weight     Self  Normal Incontinent Weight: 137 lb 2 oz (62.2 kg) Height:     BEHAVIORAL SYMPTOMS/MOOD NEUROLOGICAL BOWEL NUTRITION STATUS   (pt granddaughter states pt not normally a wanderer but did try to climb out of window which led to this admission.)   Continent Diet  AMBULATORY STATUS COMMUNICATION OF NEEDS Skin   Limited Assist Verbally Normal                       Personal Care Assistance Level of Assistance  Bathing, Dressing Bathing Assistance: Limited assistance   Dressing Assistance: Limited assistance     Functional Limitations Info             [place]  PT (By licensed PT), OT (By licensed OT)     PT Frequency: 5/wk OT Frequency: 5/wk            Contractures      Additional Factors Info  Code Status, Allergies, Psychotropic Code Status Info: DNR Allergies Info: not on file Psychotropic Info: seroquel, haldol         Current Medications (01/15/2016):  This is the current hospital  active medication list Current Facility-Administered Medications  Medication Dose Route Frequency Provider Last Rate Last Dose  . acetaminophen (TYLENOL) tablet 650 mg  650 mg Oral Q6H PRN Everrett Coombe, MD       Or  . acetaminophen (TYLENOL) suppository 650 mg  650 mg Rectal Q6H PRN Everrett Coombe, MD      . aspirin EC tablet 81 mg  81 mg Oral Daily Grayling Congress McMullen, DO   81 mg at 01/15/16 1443  . enoxaparin (LOVENOX) injection 40 mg  40 mg Subcutaneous Q24H Everrett Coombe, MD   40 mg at 01/14/16 1833  . polyethylene glycol (MIRALAX / GLYCOLAX) packet 17 g  17 g Oral Daily PRN Everrett Coombe, MD      . QUEtiapine (SEROQUEL) tablet 25 mg  25 mg Oral Daily Everrett Coombe, MD   25 mg at 01/15/16 0952  . traZODone (DESYREL) tablet 50 mg  50 mg Oral QHS PRN Katheren Shams, DO      . white petrolatum (VASELINE) gel              Discharge Medications: Please see discharge summary for a list of discharge medications.  Relevant Imaging Results:  Relevant Lab Results:   Additional Information SS#: [id number]  Jorge Ny, LCSW

## 2016-01-15 NOTE — Evaluation (Signed)
Physical Therapy Evaluation Patient Details Name: Patric Christakos MRN: [id number] DOB: 1920-09-22 Today's Date: 01/15/2016   History of Present Illness  Millie Glendenning is a 82 y.o. female presenting with altered mental status and elevated lactate. PMH significant for dementia but overall unknown.   Clinical Impression  Patient presents with decreased independence and safety with mobility due to deficits listed in PT problem list.  She will benefit from skilled PT in the acute setting to allow d/c to SNF level rehab prior to d/c home with assist.      Follow Up Recommendations SNF    Equipment Recommendations  Other (comment) (TBA)    Recommendations for Other Services       Precautions / Restrictions Precautions Precautions: Fall      Mobility  Bed Mobility Overal bed mobility: Needs Assistance Bed Mobility: Supine to Sit     Supine to sit: Supervision     General bed mobility comments: assist for safety  Transfers Overall transfer level: Needs assistance Equipment used: 1 person hand held assist Transfers: Sit to/from Stand Sit to Stand: Min assist         General transfer comment: up from EOB and toilet  Ambulation/Gait Ambulation/Gait assistance: Min assist Ambulation Distance (Feet): 160 Feet Assistive device: 1 person hand held assist Gait Pattern/deviations: Step-through pattern;Trunk flexed;Decreased stride length;Shuffle     General Gait Details: HR up to 116 with ambulation  Stairs            Wheelchair Mobility    Modified Rankin (Stroke Patients Only)       Balance Overall balance assessment: Needs assistance   Sitting balance-Leahy Scale: Good     Standing balance support: Single extremity supported;No upper extremity supported Standing balance-Leahy Scale: Poor Standing balance comment: improved with UE support, but able to wash hands at sink min A w/ mild posterior lean                             Pertinent  Vitals/Pain Pain Assessment: No/denies pain    Home Living Family/patient expects to be discharged to:: Skilled nursing facility                      Prior Function           Comments: unknown, pt reports used walker PTA     Hand Dominance        Extremity/Trunk Assessment   Upper Extremity Assessment: Generalized weakness           Lower Extremity Assessment: Generalized weakness      Cervical / Trunk Assessment: Kyphotic  Communication   Communication: No difficulties  Cognition Arousal/Alertness: Awake/alert Behavior During Therapy: Flat affect Overall Cognitive Status: No family/caregiver present to determine baseline cognitive functioning                      General Comments      Exercises     Assessment/Plan    PT Assessment Patient needs continued PT services  PT Problem List Decreased strength;Decreased activity tolerance;Decreased balance;Decreased knowledge of use of DME;Decreased mobility;Decreased cognition;Decreased skin integrity;Impaired tone          PT Treatment Interventions DME instruction;Gait training;Functional mobility training;Balance training;Therapeutic exercise;Therapeutic activities;Cognitive remediation;Patient/family education    PT Goals (Current goals can be found in the Care Plan section)  Acute Rehab PT Goals Patient Stated Goal: None stated PT Goal Formulation: Patient unable to participate  in goal setting Time For Goal Achievement: 01/22/16 Potential to Achieve Goals: Fair    Frequency Min 3X/week   Barriers to discharge        Co-evaluation               End of Session Equipment Utilized During Treatment: Gait belt Activity Tolerance: Patient tolerated treatment well Patient left: in chair;with call bell/phone within reach;with chair alarm set Nurse Communication: Other (comment) (needs assist with meal)    Functional Assessment Tool Used: Clinical Judgement Functional  Limitation: Mobility: Walking and moving around Mobility: Walking and Moving Around Current Status [phone]): At least 20 percent but less than 40 percent impaired, limited or restricted Mobility: Walking and Moving Around Goal Status [phone]): At least 20 percent but less than 40 percent impaired, limited or restricted    Time: 1415-1438 PT Time Calculation (min) (ACUTE ONLY): 23 min   Charges:   PT Evaluation $PT Eval Moderate Complexity: 1 Procedure PT Treatments $Gait Training: 8-22 mins   PT G Codes:   PT G-Codes **NOT FOR INPATIENT CLASS** Functional Assessment Tool Used: Clinical Judgement Functional Limitation: Mobility: Walking and moving around Mobility: Walking and Moving Around Current Status [id number]): At least 20 percent but less than 40 percent impaired, limited or restricted Mobility: Walking and Moving Around Goal Status [phone]): At least 20 percent but less than 40 percent impaired, limited or restricted    Reginia Naas 01/15/2016, 4:43 PM  Magda Kiel, [place] 01/15/2016

## 2016-01-15 NOTE — Clinical Social Work Note (Signed)
Clinical Social Work Assessment  Patient Details  Name: Lisa Grant MRN: [id number] Date of Birth: [birth year]  Date of referral:  01/15/16               Reason for consult:  Facility Placement                Permission sought to share information with:  Lisa Grant granted to share information::  No (pt disoriented)  Name::     Lisa Grant, [name]::  SNFs  Relationship::  granddaughter, son  Contact Information:     Housing/Transportation Living arrangements for the past 2 months:  Single Family Home Source of Information:  Other (Comment Required) (granddaughter) Patient Interpreter Needed:  None Criminal Activity/Legal Involvement Pertinent to Current Situation/Hospitalization:  No - Comment as needed Significant Relationships:  Adult Children, Other(Comment) (granddaughter) Lives with:  Other (Comment) (granddaughter) Do you feel safe going back to the place where you live?  No Need for family participation in patient care:  Yes (Comment) (needed granddaughters care at home)  Care giving concerns:  Pt lives at home with granddaughter- per granddaughter she is no longer able to care for pt at home due to health concerns (she has back issues).   Social Worker assessment / plan:  CSW spoke with granddaughter concerning plan for pt.  Lisa Grant maintains that she can NOT take patient back home and is requesting placement.  CSW explained that we would have to see if pt qualified for short term placement but that pt has no payor source that will cover long term care if that is what patient is needing.    States that pt has a son, Lisa Grant (lives in between [place] and [place]), who she has been unable to get a hold of- Lisa Grant states that if Lisa Grant unwilling to make decisions that she too is unwilling and that we will have to get APS to help get state guardianship.  States that pt has no other living relatives who can help with pt care.  Employment status:   Retired Nurse, adult PT Recommendations:  [place] / Referral to community resources:  [place]  Patient/Family's Response to care: Pt granddaughter helpful but is very adamant that she cannot reassume care for the pt after the incident where pt tried to climb out window. Lisa Grant is agreeable to SNF placement but no sure what will happen with patient after that.  Patient/Family's Understanding of and Emotional Response to Diagnosis, Current Treatment, and Prognosis:  Unclear- Lisa Grant feels at the end of her rope with pt care and feels like it would be impossible to continue helping pt at home.  Emotional Assessment Appearance:  Appears stated age Attitude/Demeanor/Rapport:  Unable to Assess Affect (typically observed):  Unable to Assess Orientation:  Oriented to Self Alcohol / Substance use:  Not Applicable Psych involvement (Current and /or in the community):  No (Comment)  Discharge Needs  Concerns to be addressed:  Care Coordination, Home Safety Concerns Readmission within the last 30 days:  No Current discharge risk:  Physical Impairment Barriers to Discharge:  Family Issues, Requiring sitter/restraints, Insurance Authorization   Lisa Grant, [place] 01/15/2016, 1:56 PM

## 2016-01-16 DIAGNOSIS — S0083XD Contusion of other part of head, subsequent encounter: Secondary | ICD-10-CM

## 2016-01-16 DIAGNOSIS — E872 Acidosis: Secondary | ICD-10-CM | POA: Diagnosis not present

## 2016-01-16 DIAGNOSIS — R410 Disorientation, unspecified: Secondary | ICD-10-CM | POA: Diagnosis not present

## 2016-01-16 LAB — BASIC METABOLIC PANEL
Anion gap: 9 (ref 5–15)
BUN: 6 mg/dL (ref 6–20)
CHLORIDE: 106 mmol/L (ref 101–111)
CO2: 24 mmol/L (ref 22–32)
CREATININE: 0.65 mg/dL (ref 0.44–1.00)
Calcium: 8.8 mg/dL — ABNORMAL LOW (ref 8.9–10.3)
GFR calc Af Amer: >60 mL/min (ref >60)
GFR calc non Af Amer: >60 mL/min (ref >60)
Glucose, Bld: 101 mg/dL — ABNORMAL HIGH (ref 65–99)
POTASSIUM: 3.3 mmol/L — AB (ref 3.5–5.1)
Sodium: 139 mmol/L (ref 135–145)

## 2016-01-16 LAB — CBC
HEMATOCRIT: 42.2 % (ref 36.0–46.0)
HEMOGLOBIN: 13.3 g/dL (ref 12.0–15.0)
MCH: 28.1 pg (ref 26.0–34.0)
MCHC: 31.5 g/dL (ref 30.0–36.0)
MCV: 89.2 fL (ref 78.0–100.0)
Platelets: 285 10*3/uL (ref 150–400)
RBC: 4.73 MIL/uL (ref 3.87–5.11)
RDW: 14.9 % (ref 11.5–15.5)
WBC: 8.6 10*3/uL (ref 4.0–10.5)

## 2016-01-16 MED ORDER — QUETIAPINE FUMARATE 25 MG PO TABS: 25.0000 mg | ORAL_TABLET | Freq: Every day | ORAL | 0 refills | Status: AC

## 2016-01-16 MED ORDER — AMLODIPINE BESYLATE 5 MG PO TABS: 5.0000 mg | ORAL_TABLET | Freq: Every day | ORAL | 0 refills | Status: AC

## 2016-01-16 MED ORDER — AMLODIPINE BESYLATE 5 MG PO TABS
5.0000 mg | ORAL_TABLET | Freq: Every day | ORAL | Status: DC
  Administered 2016-01-16: 5 mg via ORAL
  Filled 2016-01-16: qty 1

## 2016-01-16 NOTE — Progress Notes (Signed)
Pt denture found in room after PTAR has already picked pt up. Denture cleaned and Charge RN notified; pt granddaughter [name] notified to come pick up but she said not today and she will come pick it up later. Pt sticker placed on denture up and placed in discharge pt belongings area. PDarden Palmer Olita Takeshita Rn

## 2016-01-16 NOTE — Progress Notes (Signed)
CSW made an APS report regarding patient needing someone to make decisions. APS worker will follow up with patient.  Lisa Grant LCSWA [phone]

## 2016-01-16 NOTE — Clinical Social Work Placement (Signed)
   CLINICAL SOCIAL WORK PLACEMENT  NOTE  Date:  01/16/2016  Patient Details  Name: Lisa Grant MRN: [id number] Date of Birth: Jul 31, 1920  Clinical Social Work is seeking post-discharge placement for this patient at the [place] level of care (*CSW will initial, date and re-position this form in  chart as items are completed):  Yes   Patient/family provided with [place] Work Department's list of facilities offering this level of care within the geographic area requested by the patient (or if unable, by the patient's family).  Yes   Patient/family informed of their freedom to choose among providers that offer the needed level of care, that participate in Medicare, Medicaid or managed care program needed by the patient, have an available bed and are willing to accept the patient.  Yes   Patient/family informed of [facility]'s ownership interest in [facility] and [facility], as well as of the fact that they are under no obligation to receive care at these facilities.  PASRR submitted to EDS on 01/15/16     PASRR number received on 01/15/16     Existing PASRR number confirmed on       FL2 transmitted to all facilities in geographic area requested by pt/family on 01/15/16     FL2 transmitted to all facilities within larger geographic area on       Patient informed that his/her managed care company has contracts with or will negotiate with certain facilities, including the following:        Yes   Patient/family informed of bed offers received.  Patient chooses bed at [facility] and [facility] recommends and patient chooses bed at      Patient to be transferred to [facility] on 01/16/16.  Patient to be transferred to facility by PTAR     Patient family notified on 01/16/16 of transfer.  Name of family member notified:  [name], granddaughter     PHYSICIAN Please sign FL2     Additional Comment:     _______________________________________________ Benard Halsted, [place] 01/16/2016, 2:21 PM
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
## 2016-01-16 NOTE — Progress Notes (Signed)
Pt discharge education and instructions completed. Pt discharge to [facility] and report called off to nurse Josselyn at the facility. Pt IV and telemetry removed prior to discharge. Pt transported off unit via stretcher by PTAR with belongings to the side. Delia Heady RN

## 2016-01-16 NOTE — Progress Notes (Signed)
Patient will DC to: [facility] date: 01/16/16 Family notified: Granddaughter and APS Transport by: Corey Harold   Per MD patient ready for DC to [facility]. RN, patient, patient's family, and facility notified of DC. Discharge Summary sent to facility. RN given number for report. DC packet on chart. Ambulance transport requested for patient.   CSW signing off.  Cedric Fishman, [place] Social Worker [phone]

## 2016-01-16 NOTE — Discharge Summary (Signed)
[facility] Discharge Summary  Patient name: Lisa Grant Medical record number: [id number] Date of birth: 11-11-1920 Age: 82 y.o. Gender: female Date of Admission: 01/14/2016  Date of Discharge: 01/16/2016 Admitting Physician: Lisa Rio, MD  Primary Care Provider: No primary care provider on file. Consultants: None  Indication for Hospitalization: Altered mental status  Discharge Diagnoses/Problem List:  Patient Active Problem List   Diagnosis Date Noted  . Acute delirium   . Contusion of face   . Lactic acidosis   . Abnormality of gait   . Community acquired pneumonia of right middle lobe of lung ([place]) 01/14/2016  . Altered mental status 01/14/2016     Disposition: SNF  Discharge Condition: Stable  Discharge Exam:  Temp:  [98.4 F (36.9 C)] 98.4 F (36.9 C) (11/09 0514) Pulse Rate:  [95-97] 97 (11/09 0514) Resp:  [16] 16 (11/09 0514) BP: (157-171)/(86-135) 157/99 (11/09 0514) SpO2:  [92 %-99 %] 99 % (11/09 0514) Physical Exam: General: NAD, rests comfortably, disoriented but pleasant Cardiovascular: RRR, no +systolic ejection murmer Respiratory: CTA bil, W/R/R Abdomen: soft and nontender, nondistended Extremities: warm and well-perfused Psych: disoriented to place and time, oriented to person Neuro: +cogwheel rigidity, +bilateral UE intention tremor worst at rest  Brief Hospital Course:  Lisa Grant is a 82 y.o. female with unknown PMH but thought to have likely parkinson's disease and dementia who presented to the hospital with elevated lactate and altered mental status (oriented to self but not time or place).  Patient was left in the ED without any information, with only a bag of pills. There was some history per ED physician that caretaker noted patient had agitation (possibly attempting to climb out a window, unsure if patient had a fall in the bathroom).  Some history per ED provider that patient may have experienced a fall  prior to arriving and that since then has been altered. Upon FPTS evaluation in the ED, patient is pleasant but only oriented to self. States she believes she is at [facility]" and she does not know the year. Her thought process is not linear, she fixated on "stop the smoking," and "the tobacco is too strong."  The patient denied any pain or acute physical complaints.  In the emergency department, she was found to have a lactic acid elevated at 4.86, improved to normal with IVF. She was found to have a possible RML pneumonia on chest xray and started on rocephin, azithromycin in the ED which were subsequently stopped as there were no other signs of infection.  Head CT was negative for acute intercranial findings. CT was positive for facial and forehead contusions without fracture and atrophy consistent with dementia.   Patient was admitted to the hospital and social work as well as physical therapy and occupational therapy were consulted.  The recommendations were that the patient go to SNF, with 24 hour surveillance, and fall precautions. Social work spoke with patient's granddaughter "Lisa Grant" who is adamant she cannot continue patient care, will not make decisions for patient. Patient has a son Lisa Grant who is also unwilling to make decisions for the patient per SW note. CSW may get APS involved to help get state guardianship.  On the first day of admission haldol was required for agitation (patient was pleasant but kept trying to get up out of her chair, walk around).  The second night of admission no extra antipsychotics were required other than the seroquel 25 mg daily that had been started.  SNF placement was  found and the patient's granddaughter agreed to placement.   Issues for Follow Up:  1. Patient was started on amlodipine 5 mg daily for high pressures in the hospital. 2. Seroquel 25 mg daily was started for altered mental status. 3. Recommend frequent re-orientation, delirium precautions 4. Can  consider CIWA scoring, unclear if patient had previously been taking Xanax, recommend monitoring for benzodiazapine withdrawal. 5. Can consider adding patient's ropinirole back if needed for parkinson's symptoms.   Significant Procedures: None  Significant Labs and Imaging:  Dg Chest 2 View 01/14/2016 FINDINGS:Heart is mildly enlarged. Atherosclerotic calcifications are present at the aortic arch. Mild interstitial coarsening is likely chronic. Superimposed ill-defined airspace disease is most prominent within the right middle lobe. There is mild bibasilar atelectasis is well. Exaggerated kyphosis is present. Marked osteopenia is noted. The visualized soft tissues and bony thorax are otherwise unremarkable.  IMPRESSION:  1. Ill-defined right middle lobe airspace disease and minimal bibasilar disease likely reflects atelectasis. Early infection is also considered.  2. Some element of chronic interstitial coarsening is present as well.  3. Borderline cardiomegaly without failure.  4. Atherosclerosis of the thoracic aorta.   Ct Head Wo Contrast - 01/14/2016 FINDINGS: Brain: No evidence of acute infarction, hemorrhage, hydrocephalus, extra-axial collection or mass lesion/mass effect. Brain atrophy with moderate to advanced mesial temporal volume loss in this patient with history of dementia, probable Alzheimer's disease. Chronic small-vessel ischemic disease in the deep cerebral white matter. Remote small vessel infarct in the right cerebellum. Vascular: Atherosclerotic calcification. Skull: Left forehead and right face contusions. No underlying fracture. Sinuses/Orbits: Senescent globe calcifications. No acute finding  IMPRESSION:  1. No acute intracranial finding.  2. Facial and forehead contusions without fracture.  3. Atrophy as described above.   Recent Labs Lab 01/14/16 1307 01/15/16 0221 01/16/16 0523  WBC 9.0 8.0 8.6  HGB 12.1 11.2* 13.3  HCT 36.8 35.3* 42.2  PLT 266 252 285     Recent Labs Lab 01/14/16 1307 01/15/16 0221 01/16/16 0523  NA 140 142 139  K 4.1 3.8 3.3*  CL 109 110 106  CO2 17* 23 24  GLUCOSE 82 102* 101*  BUN 20 11 6   CREATININE 0.90 0.68 0.65  CALCIUM 9.3 8.4* 8.8*  ALKPHOS 49  --   --   AST 31  --   --   ALT 13*  --   --   ALBUMIN 4.0  --   --      Results/Tests Pending at Time of Discharge: None  Discharge Medications:    Medication List    STOP taking these medications   ALPRAZolam 0.25 MG tablet Commonly known as:  XANAX   citalopram 40 MG tablet Commonly known as:  CELEXA   clopidogrel 75 MG tablet Commonly known as:  PLAVIX   furosemide 20 MG tablet Commonly known as:  LASIX   NIFEdipine 30 MG 24 hr tablet Commonly known as:  PROCARDIA-XL/ADALAT-CC/NIFEDICAL-XL   risperiDONE 0.25 MG tablet Commonly known as:  RISPERDAL   rivastigmine 3 MG capsule Commonly known as:  EXELON   rOPINIRole 0.5 MG tablet Commonly known as:  REQUIP     TAKE these medications   amLODipine 5 MG tablet Commonly known as:  NORVASC Take 1 tablet (5 mg total) by mouth daily.   aspirin 81 MG chewable tablet Chew 81 mg by mouth daily.   QUEtiapine 25 MG tablet Commonly known as:  SEROQUEL Take 1 tablet (25 mg total) by mouth daily. Start taking on:  01/17/2016  ranitidine 150 MG tablet Commonly known as:  ZANTAC Take 150 mg by mouth at bedtime.   traZODone 50 MG tablet Commonly known as:  DESYREL Take 50 mg by mouth at bedtime as needed for sleep.       Discharge Instructions: Please refer to Patient Instructions section of EMR for full details.  Patient was counseled important signs and symptoms that should prompt return to medical care, changes in medications, dietary instructions, activity restrictions, and follow up appointments.   Follow-Up Appointments:   Everrett Coombe, MD 01/16/2016, 2:10 PM PGY-1, [place]

## 2016-01-16 NOTE — Progress Notes (Signed)
Regarding ABN, CM has not received call from granddaughter Lisa Grant at [phone] to further discuss ABN. CM has called granddaughter twice today unsuccessfully. CM was unable to speak with Lisa Grant and unable to leave voice message. ABN left @ bedside. Whitman Hero RN,BSN,CM

## 2016-01-16 NOTE — Discharge Instructions (Signed)
Please continue to take your medications as prescribed. 

## 2016-03-09 DIAGNOSIS — R262 Difficulty in walking, not elsewhere classified: Secondary | ICD-10-CM | POA: Diagnosis not present

## 2016-03-09 DIAGNOSIS — R1312 Dysphagia, oropharyngeal phase: Secondary | ICD-10-CM | POA: Diagnosis not present

## 2016-03-09 DIAGNOSIS — M6281 Muscle weakness (generalized): Secondary | ICD-10-CM | POA: Diagnosis not present

## 2016-03-09 DIAGNOSIS — F419 Anxiety disorder, unspecified: Secondary | ICD-10-CM | POA: Diagnosis not present

## 2016-03-10 DIAGNOSIS — M6281 Muscle weakness (generalized): Secondary | ICD-10-CM | POA: Diagnosis not present

## 2016-03-10 DIAGNOSIS — F419 Anxiety disorder, unspecified: Secondary | ICD-10-CM | POA: Diagnosis not present

## 2016-03-10 DIAGNOSIS — R262 Difficulty in walking, not elsewhere classified: Secondary | ICD-10-CM | POA: Diagnosis not present

## 2016-03-10 DIAGNOSIS — R1312 Dysphagia, oropharyngeal phase: Secondary | ICD-10-CM | POA: Diagnosis not present

## 2016-03-11 DIAGNOSIS — F419 Anxiety disorder, unspecified: Secondary | ICD-10-CM | POA: Diagnosis not present

## 2016-03-11 DIAGNOSIS — M6281 Muscle weakness (generalized): Secondary | ICD-10-CM | POA: Diagnosis not present

## 2016-03-11 DIAGNOSIS — R262 Difficulty in walking, not elsewhere classified: Secondary | ICD-10-CM | POA: Diagnosis not present

## 2016-03-11 DIAGNOSIS — R1312 Dysphagia, oropharyngeal phase: Secondary | ICD-10-CM | POA: Diagnosis not present

## 2016-03-13 DIAGNOSIS — G301 Alzheimer's disease with late onset: Secondary | ICD-10-CM | POA: Diagnosis not present

## 2016-03-13 DIAGNOSIS — I10 Essential (primary) hypertension: Secondary | ICD-10-CM | POA: Diagnosis not present

## 2016-03-13 DIAGNOSIS — I63541 Cerebral infarction due to unspecified occlusion or stenosis of right cerebellar artery: Secondary | ICD-10-CM | POA: Diagnosis not present

## 2016-03-13 DIAGNOSIS — R1310 Dysphagia, unspecified: Secondary | ICD-10-CM | POA: Diagnosis not present

## 2016-03-20 DIAGNOSIS — R001 Bradycardia, unspecified: Secondary | ICD-10-CM | POA: Diagnosis not present

## 2016-03-20 DIAGNOSIS — R9431 Abnormal electrocardiogram [ECG] [EKG]: Secondary | ICD-10-CM | POA: Diagnosis not present

## 2016-03-20 DIAGNOSIS — Z79899 Other long term (current) drug therapy: Secondary | ICD-10-CM | POA: Diagnosis not present

## 2016-03-22 DIAGNOSIS — I63541 Cerebral infarction due to unspecified occlusion or stenosis of right cerebellar artery: Secondary | ICD-10-CM | POA: Diagnosis not present

## 2016-03-22 DIAGNOSIS — F419 Anxiety disorder, unspecified: Secondary | ICD-10-CM | POA: Diagnosis not present

## 2016-03-22 DIAGNOSIS — I10 Essential (primary) hypertension: Secondary | ICD-10-CM | POA: Diagnosis not present

## 2016-03-22 DIAGNOSIS — R1310 Dysphagia, unspecified: Secondary | ICD-10-CM | POA: Diagnosis not present

## 2016-04-10 DIAGNOSIS — G308 Other Alzheimer's disease: Secondary | ICD-10-CM | POA: Diagnosis not present

## 2016-04-10 DIAGNOSIS — F0281 Dementia in other diseases classified elsewhere with behavioral disturbance: Secondary | ICD-10-CM | POA: Diagnosis not present

## 2016-04-10 DIAGNOSIS — F411 Generalized anxiety disorder: Secondary | ICD-10-CM | POA: Diagnosis not present

## 2016-04-10 DIAGNOSIS — F321 Major depressive disorder, single episode, moderate: Secondary | ICD-10-CM | POA: Diagnosis not present

## 2016-04-18 DIAGNOSIS — R1310 Dysphagia, unspecified: Secondary | ICD-10-CM | POA: Diagnosis not present

## 2016-04-18 DIAGNOSIS — G301 Alzheimer's disease with late onset: Secondary | ICD-10-CM | POA: Diagnosis not present

## 2016-04-18 DIAGNOSIS — I10 Essential (primary) hypertension: Secondary | ICD-10-CM | POA: Diagnosis not present

## 2016-04-18 DIAGNOSIS — I63541 Cerebral infarction due to unspecified occlusion or stenosis of right cerebellar artery: Secondary | ICD-10-CM | POA: Diagnosis not present

## 2016-05-08 DIAGNOSIS — M6281 Muscle weakness (generalized): Secondary | ICD-10-CM | POA: Diagnosis not present

## 2016-05-08 DIAGNOSIS — I10 Essential (primary) hypertension: Secondary | ICD-10-CM | POA: Diagnosis not present

## 2016-05-08 DIAGNOSIS — R1310 Dysphagia, unspecified: Secondary | ICD-10-CM | POA: Diagnosis not present

## 2016-05-08 DIAGNOSIS — I63541 Cerebral infarction due to unspecified occlusion or stenosis of right cerebellar artery: Secondary | ICD-10-CM | POA: Diagnosis not present

## 2016-05-09 DIAGNOSIS — F0151 Vascular dementia with behavioral disturbance: Secondary | ICD-10-CM | POA: Diagnosis not present

## 2016-05-09 DIAGNOSIS — F419 Anxiety disorder, unspecified: Secondary | ICD-10-CM | POA: Diagnosis not present

## 2016-05-09 DIAGNOSIS — I10 Essential (primary) hypertension: Secondary | ICD-10-CM | POA: Diagnosis not present

## 2016-05-09 DIAGNOSIS — D508 Other iron deficiency anemias: Secondary | ICD-10-CM | POA: Diagnosis not present

## 2016-05-18 DIAGNOSIS — I4510 Unspecified right bundle-branch block: Secondary | ICD-10-CM | POA: Diagnosis not present

## 2016-05-18 DIAGNOSIS — I63541 Cerebral infarction due to unspecified occlusion or stenosis of right cerebellar artery: Secondary | ICD-10-CM | POA: Diagnosis not present

## 2016-05-18 DIAGNOSIS — R9431 Abnormal electrocardiogram [ECG] [EKG]: Secondary | ICD-10-CM | POA: Diagnosis not present

## 2016-05-19 DIAGNOSIS — G301 Alzheimer's disease with late onset: Secondary | ICD-10-CM | POA: Diagnosis not present

## 2016-05-19 DIAGNOSIS — F482 Pseudobulbar affect: Secondary | ICD-10-CM | POA: Diagnosis not present

## 2016-05-19 DIAGNOSIS — R1310 Dysphagia, unspecified: Secondary | ICD-10-CM | POA: Diagnosis not present

## 2016-05-19 DIAGNOSIS — I63541 Cerebral infarction due to unspecified occlusion or stenosis of right cerebellar artery: Secondary | ICD-10-CM | POA: Diagnosis not present

## 2016-06-05 DIAGNOSIS — F411 Generalized anxiety disorder: Secondary | ICD-10-CM | POA: Diagnosis not present

## 2016-06-05 DIAGNOSIS — F0281 Dementia in other diseases classified elsewhere with behavioral disturbance: Secondary | ICD-10-CM | POA: Diagnosis not present

## 2016-06-05 DIAGNOSIS — G308 Other Alzheimer's disease: Secondary | ICD-10-CM | POA: Diagnosis not present

## 2016-06-05 DIAGNOSIS — F321 Major depressive disorder, single episode, moderate: Secondary | ICD-10-CM | POA: Diagnosis not present

## 2016-06-09 DIAGNOSIS — M6281 Muscle weakness (generalized): Secondary | ICD-10-CM | POA: Diagnosis not present

## 2016-06-09 DIAGNOSIS — F22 Delusional disorders: Secondary | ICD-10-CM | POA: Diagnosis not present

## 2016-06-09 DIAGNOSIS — F0151 Vascular dementia with behavioral disturbance: Secondary | ICD-10-CM | POA: Diagnosis not present

## 2016-06-09 DIAGNOSIS — G301 Alzheimer's disease with late onset: Secondary | ICD-10-CM | POA: Diagnosis not present

## 2016-06-10 DIAGNOSIS — Z79899 Other long term (current) drug therapy: Secondary | ICD-10-CM | POA: Diagnosis not present

## 2016-06-10 DIAGNOSIS — D649 Anemia, unspecified: Secondary | ICD-10-CM | POA: Diagnosis not present

## 2016-06-17 DIAGNOSIS — R1312 Dysphagia, oropharyngeal phase: Secondary | ICD-10-CM | POA: Diagnosis not present

## 2016-06-17 DIAGNOSIS — R262 Difficulty in walking, not elsewhere classified: Secondary | ICD-10-CM | POA: Diagnosis not present

## 2016-06-17 DIAGNOSIS — M6281 Muscle weakness (generalized): Secondary | ICD-10-CM | POA: Diagnosis not present

## 2016-06-17 DIAGNOSIS — F419 Anxiety disorder, unspecified: Secondary | ICD-10-CM | POA: Diagnosis not present

## 2016-06-18 DIAGNOSIS — F419 Anxiety disorder, unspecified: Secondary | ICD-10-CM | POA: Diagnosis not present

## 2016-06-18 DIAGNOSIS — R262 Difficulty in walking, not elsewhere classified: Secondary | ICD-10-CM | POA: Diagnosis not present

## 2016-06-18 DIAGNOSIS — M6281 Muscle weakness (generalized): Secondary | ICD-10-CM | POA: Diagnosis not present

## 2016-06-18 DIAGNOSIS — R1312 Dysphagia, oropharyngeal phase: Secondary | ICD-10-CM | POA: Diagnosis not present

## 2016-06-20 DIAGNOSIS — I10 Essential (primary) hypertension: Secondary | ICD-10-CM | POA: Diagnosis not present

## 2016-06-20 DIAGNOSIS — I63541 Cerebral infarction due to unspecified occlusion or stenosis of right cerebellar artery: Secondary | ICD-10-CM | POA: Diagnosis not present

## 2016-06-20 DIAGNOSIS — I483 Typical atrial flutter: Secondary | ICD-10-CM | POA: Diagnosis not present

## 2016-06-20 DIAGNOSIS — R1310 Dysphagia, unspecified: Secondary | ICD-10-CM | POA: Diagnosis not present

## 2016-06-22 DIAGNOSIS — R1312 Dysphagia, oropharyngeal phase: Secondary | ICD-10-CM | POA: Diagnosis not present

## 2016-06-22 DIAGNOSIS — R262 Difficulty in walking, not elsewhere classified: Secondary | ICD-10-CM | POA: Diagnosis not present

## 2016-06-22 DIAGNOSIS — M6281 Muscle weakness (generalized): Secondary | ICD-10-CM | POA: Diagnosis not present

## 2016-06-22 DIAGNOSIS — F419 Anxiety disorder, unspecified: Secondary | ICD-10-CM | POA: Diagnosis not present

## 2016-06-23 DIAGNOSIS — R262 Difficulty in walking, not elsewhere classified: Secondary | ICD-10-CM | POA: Diagnosis not present

## 2016-06-23 DIAGNOSIS — M6281 Muscle weakness (generalized): Secondary | ICD-10-CM | POA: Diagnosis not present

## 2016-06-23 DIAGNOSIS — F419 Anxiety disorder, unspecified: Secondary | ICD-10-CM | POA: Diagnosis not present

## 2016-06-23 DIAGNOSIS — R1312 Dysphagia, oropharyngeal phase: Secondary | ICD-10-CM | POA: Diagnosis not present

## 2016-06-24 DIAGNOSIS — R262 Difficulty in walking, not elsewhere classified: Secondary | ICD-10-CM | POA: Diagnosis not present

## 2016-06-24 DIAGNOSIS — M6281 Muscle weakness (generalized): Secondary | ICD-10-CM | POA: Diagnosis not present

## 2016-06-24 DIAGNOSIS — F419 Anxiety disorder, unspecified: Secondary | ICD-10-CM | POA: Diagnosis not present

## 2016-06-24 DIAGNOSIS — R1312 Dysphagia, oropharyngeal phase: Secondary | ICD-10-CM | POA: Diagnosis not present

## 2016-06-25 DIAGNOSIS — F419 Anxiety disorder, unspecified: Secondary | ICD-10-CM | POA: Diagnosis not present

## 2016-06-25 DIAGNOSIS — M6281 Muscle weakness (generalized): Secondary | ICD-10-CM | POA: Diagnosis not present

## 2016-06-25 DIAGNOSIS — R262 Difficulty in walking, not elsewhere classified: Secondary | ICD-10-CM | POA: Diagnosis not present

## 2016-06-25 DIAGNOSIS — R1312 Dysphagia, oropharyngeal phase: Secondary | ICD-10-CM | POA: Diagnosis not present

## 2016-06-26 DIAGNOSIS — M6281 Muscle weakness (generalized): Secondary | ICD-10-CM | POA: Diagnosis not present

## 2016-06-26 DIAGNOSIS — R262 Difficulty in walking, not elsewhere classified: Secondary | ICD-10-CM | POA: Diagnosis not present

## 2016-06-26 DIAGNOSIS — R1312 Dysphagia, oropharyngeal phase: Secondary | ICD-10-CM | POA: Diagnosis not present

## 2016-06-26 DIAGNOSIS — F419 Anxiety disorder, unspecified: Secondary | ICD-10-CM | POA: Diagnosis not present

## 2016-06-29 DIAGNOSIS — R1312 Dysphagia, oropharyngeal phase: Secondary | ICD-10-CM | POA: Diagnosis not present

## 2016-06-29 DIAGNOSIS — M6281 Muscle weakness (generalized): Secondary | ICD-10-CM | POA: Diagnosis not present

## 2016-06-29 DIAGNOSIS — F419 Anxiety disorder, unspecified: Secondary | ICD-10-CM | POA: Diagnosis not present

## 2016-06-29 DIAGNOSIS — R262 Difficulty in walking, not elsewhere classified: Secondary | ICD-10-CM | POA: Diagnosis not present

## 2016-06-30 DIAGNOSIS — F419 Anxiety disorder, unspecified: Secondary | ICD-10-CM | POA: Diagnosis not present

## 2016-06-30 DIAGNOSIS — R1312 Dysphagia, oropharyngeal phase: Secondary | ICD-10-CM | POA: Diagnosis not present

## 2016-06-30 DIAGNOSIS — M6281 Muscle weakness (generalized): Secondary | ICD-10-CM | POA: Diagnosis not present

## 2016-06-30 DIAGNOSIS — R262 Difficulty in walking, not elsewhere classified: Secondary | ICD-10-CM | POA: Diagnosis not present

## 2016-07-01 DIAGNOSIS — M6281 Muscle weakness (generalized): Secondary | ICD-10-CM | POA: Diagnosis not present

## 2016-07-01 DIAGNOSIS — R1312 Dysphagia, oropharyngeal phase: Secondary | ICD-10-CM | POA: Diagnosis not present

## 2016-07-01 DIAGNOSIS — F419 Anxiety disorder, unspecified: Secondary | ICD-10-CM | POA: Diagnosis not present

## 2016-07-01 DIAGNOSIS — R262 Difficulty in walking, not elsewhere classified: Secondary | ICD-10-CM | POA: Diagnosis not present

## 2016-07-02 DIAGNOSIS — R1312 Dysphagia, oropharyngeal phase: Secondary | ICD-10-CM | POA: Diagnosis not present

## 2016-07-02 DIAGNOSIS — F419 Anxiety disorder, unspecified: Secondary | ICD-10-CM | POA: Diagnosis not present

## 2016-07-02 DIAGNOSIS — R262 Difficulty in walking, not elsewhere classified: Secondary | ICD-10-CM | POA: Diagnosis not present

## 2016-07-02 DIAGNOSIS — M6281 Muscle weakness (generalized): Secondary | ICD-10-CM | POA: Diagnosis not present

## 2016-07-06 DIAGNOSIS — R55 Syncope and collapse: Secondary | ICD-10-CM | POA: Diagnosis not present

## 2016-07-06 DIAGNOSIS — J988 Other specified respiratory disorders: Secondary | ICD-10-CM | POA: Diagnosis not present

## 2016-07-06 DIAGNOSIS — R5081 Fever presenting with conditions classified elsewhere: Secondary | ICD-10-CM | POA: Diagnosis not present

## 2016-07-06 DIAGNOSIS — R918 Other nonspecific abnormal finding of lung field: Secondary | ICD-10-CM | POA: Diagnosis not present

## 2016-07-12 DIAGNOSIS — I63541 Cerebral infarction due to unspecified occlusion or stenosis of right cerebellar artery: Secondary | ICD-10-CM | POA: Diagnosis not present

## 2016-07-12 DIAGNOSIS — F482 Pseudobulbar affect: Secondary | ICD-10-CM | POA: Diagnosis not present

## 2016-07-12 DIAGNOSIS — I10 Essential (primary) hypertension: Secondary | ICD-10-CM | POA: Diagnosis not present

## 2016-07-12 DIAGNOSIS — F0281 Dementia in other diseases classified elsewhere with behavioral disturbance: Secondary | ICD-10-CM | POA: Diagnosis not present

## 2016-07-17 DIAGNOSIS — F0281 Dementia in other diseases classified elsewhere with behavioral disturbance: Secondary | ICD-10-CM | POA: Diagnosis not present

## 2016-07-17 DIAGNOSIS — G308 Other Alzheimer's disease: Secondary | ICD-10-CM | POA: Diagnosis not present

## 2016-07-17 DIAGNOSIS — F321 Major depressive disorder, single episode, moderate: Secondary | ICD-10-CM | POA: Diagnosis not present

## 2016-07-17 DIAGNOSIS — F411 Generalized anxiety disorder: Secondary | ICD-10-CM | POA: Diagnosis not present

## 2016-07-22 DIAGNOSIS — R262 Difficulty in walking, not elsewhere classified: Secondary | ICD-10-CM | POA: Diagnosis not present

## 2016-07-22 DIAGNOSIS — F419 Anxiety disorder, unspecified: Secondary | ICD-10-CM | POA: Diagnosis not present

## 2016-07-22 DIAGNOSIS — R1312 Dysphagia, oropharyngeal phase: Secondary | ICD-10-CM | POA: Diagnosis not present

## 2016-07-22 DIAGNOSIS — M6281 Muscle weakness (generalized): Secondary | ICD-10-CM | POA: Diagnosis not present

## 2016-08-04 DIAGNOSIS — I10 Essential (primary) hypertension: Secondary | ICD-10-CM | POA: Diagnosis not present

## 2016-08-04 DIAGNOSIS — F419 Anxiety disorder, unspecified: Secondary | ICD-10-CM | POA: Diagnosis not present

## 2016-08-04 DIAGNOSIS — F0151 Vascular dementia with behavioral disturbance: Secondary | ICD-10-CM | POA: Diagnosis not present

## 2016-08-04 DIAGNOSIS — M6281 Muscle weakness (generalized): Secondary | ICD-10-CM | POA: Diagnosis not present

## 2016-08-11 DIAGNOSIS — R7989 Other specified abnormal findings of blood chemistry: Secondary | ICD-10-CM | POA: Diagnosis not present

## 2016-08-11 DIAGNOSIS — D649 Anemia, unspecified: Secondary | ICD-10-CM | POA: Diagnosis not present

## 2016-08-11 DIAGNOSIS — R569 Unspecified convulsions: Secondary | ICD-10-CM | POA: Diagnosis not present

## 2016-08-12 DIAGNOSIS — N390 Urinary tract infection, site not specified: Secondary | ICD-10-CM | POA: Diagnosis not present

## 2016-08-12 DIAGNOSIS — I10 Essential (primary) hypertension: Secondary | ICD-10-CM | POA: Diagnosis not present

## 2016-08-12 DIAGNOSIS — F0151 Vascular dementia with behavioral disturbance: Secondary | ICD-10-CM | POA: Diagnosis not present

## 2016-08-12 DIAGNOSIS — F419 Anxiety disorder, unspecified: Secondary | ICD-10-CM | POA: Diagnosis not present

## 2016-08-12 DIAGNOSIS — D508 Other iron deficiency anemias: Secondary | ICD-10-CM | POA: Diagnosis not present

## 2016-08-12 DIAGNOSIS — M6281 Muscle weakness (generalized): Secondary | ICD-10-CM | POA: Diagnosis not present

## 2016-08-12 DIAGNOSIS — R1312 Dysphagia, oropharyngeal phase: Secondary | ICD-10-CM | POA: Diagnosis not present

## 2016-08-12 DIAGNOSIS — R262 Difficulty in walking, not elsewhere classified: Secondary | ICD-10-CM | POA: Diagnosis not present

## 2016-08-12 DIAGNOSIS — R319 Hematuria, unspecified: Secondary | ICD-10-CM | POA: Diagnosis not present

## 2016-08-13 DIAGNOSIS — R1312 Dysphagia, oropharyngeal phase: Secondary | ICD-10-CM | POA: Diagnosis not present

## 2016-08-13 DIAGNOSIS — R262 Difficulty in walking, not elsewhere classified: Secondary | ICD-10-CM | POA: Diagnosis not present

## 2016-08-13 DIAGNOSIS — F419 Anxiety disorder, unspecified: Secondary | ICD-10-CM | POA: Diagnosis not present

## 2016-08-13 DIAGNOSIS — M6281 Muscle weakness (generalized): Secondary | ICD-10-CM | POA: Diagnosis not present

## 2016-08-14 DIAGNOSIS — G301 Alzheimer's disease with late onset: Secondary | ICD-10-CM | POA: Diagnosis not present

## 2016-08-14 DIAGNOSIS — R262 Difficulty in walking, not elsewhere classified: Secondary | ICD-10-CM | POA: Diagnosis not present

## 2016-08-14 DIAGNOSIS — F419 Anxiety disorder, unspecified: Secondary | ICD-10-CM | POA: Diagnosis not present

## 2016-08-14 DIAGNOSIS — M6281 Muscle weakness (generalized): Secondary | ICD-10-CM | POA: Diagnosis not present

## 2016-08-14 DIAGNOSIS — R1312 Dysphagia, oropharyngeal phase: Secondary | ICD-10-CM | POA: Diagnosis not present

## 2016-08-14 DIAGNOSIS — I10 Essential (primary) hypertension: Secondary | ICD-10-CM | POA: Diagnosis not present

## 2016-08-16 DIAGNOSIS — M6281 Muscle weakness (generalized): Secondary | ICD-10-CM | POA: Diagnosis not present

## 2016-08-16 DIAGNOSIS — R1312 Dysphagia, oropharyngeal phase: Secondary | ICD-10-CM | POA: Diagnosis not present

## 2016-08-16 DIAGNOSIS — F419 Anxiety disorder, unspecified: Secondary | ICD-10-CM | POA: Diagnosis not present

## 2016-08-16 DIAGNOSIS — R262 Difficulty in walking, not elsewhere classified: Secondary | ICD-10-CM | POA: Diagnosis not present

## 2016-08-17 DIAGNOSIS — R1312 Dysphagia, oropharyngeal phase: Secondary | ICD-10-CM | POA: Diagnosis not present

## 2016-08-17 DIAGNOSIS — R319 Hematuria, unspecified: Secondary | ICD-10-CM | POA: Diagnosis not present

## 2016-08-17 DIAGNOSIS — R262 Difficulty in walking, not elsewhere classified: Secondary | ICD-10-CM | POA: Diagnosis not present

## 2016-08-17 DIAGNOSIS — Z79899 Other long term (current) drug therapy: Secondary | ICD-10-CM | POA: Diagnosis not present

## 2016-08-17 DIAGNOSIS — F419 Anxiety disorder, unspecified: Secondary | ICD-10-CM | POA: Diagnosis not present

## 2016-08-17 DIAGNOSIS — N390 Urinary tract infection, site not specified: Secondary | ICD-10-CM | POA: Diagnosis not present

## 2016-08-17 DIAGNOSIS — M6281 Muscle weakness (generalized): Secondary | ICD-10-CM | POA: Diagnosis not present

## 2016-08-18 DIAGNOSIS — M6281 Muscle weakness (generalized): Secondary | ICD-10-CM | POA: Diagnosis not present

## 2016-08-18 DIAGNOSIS — R1312 Dysphagia, oropharyngeal phase: Secondary | ICD-10-CM | POA: Diagnosis not present

## 2016-08-18 DIAGNOSIS — R262 Difficulty in walking, not elsewhere classified: Secondary | ICD-10-CM | POA: Diagnosis not present

## 2016-08-18 DIAGNOSIS — F419 Anxiety disorder, unspecified: Secondary | ICD-10-CM | POA: Diagnosis not present

## 2016-08-20 DIAGNOSIS — R262 Difficulty in walking, not elsewhere classified: Secondary | ICD-10-CM | POA: Diagnosis not present

## 2016-08-20 DIAGNOSIS — F419 Anxiety disorder, unspecified: Secondary | ICD-10-CM | POA: Diagnosis not present

## 2016-08-20 DIAGNOSIS — R1312 Dysphagia, oropharyngeal phase: Secondary | ICD-10-CM | POA: Diagnosis not present

## 2016-08-20 DIAGNOSIS — M6281 Muscle weakness (generalized): Secondary | ICD-10-CM | POA: Diagnosis not present

## 2016-08-21 DIAGNOSIS — M6281 Muscle weakness (generalized): Secondary | ICD-10-CM | POA: Diagnosis not present

## 2016-08-21 DIAGNOSIS — R1312 Dysphagia, oropharyngeal phase: Secondary | ICD-10-CM | POA: Diagnosis not present

## 2016-08-21 DIAGNOSIS — F419 Anxiety disorder, unspecified: Secondary | ICD-10-CM | POA: Diagnosis not present

## 2016-08-21 DIAGNOSIS — R262 Difficulty in walking, not elsewhere classified: Secondary | ICD-10-CM | POA: Diagnosis not present

## 2016-08-24 DIAGNOSIS — R1312 Dysphagia, oropharyngeal phase: Secondary | ICD-10-CM | POA: Diagnosis not present

## 2016-08-24 DIAGNOSIS — M6281 Muscle weakness (generalized): Secondary | ICD-10-CM | POA: Diagnosis not present

## 2016-08-24 DIAGNOSIS — R262 Difficulty in walking, not elsewhere classified: Secondary | ICD-10-CM | POA: Diagnosis not present

## 2016-08-24 DIAGNOSIS — F419 Anxiety disorder, unspecified: Secondary | ICD-10-CM | POA: Diagnosis not present

## 2016-08-26 DIAGNOSIS — M6281 Muscle weakness (generalized): Secondary | ICD-10-CM | POA: Diagnosis not present

## 2016-08-26 DIAGNOSIS — R1312 Dysphagia, oropharyngeal phase: Secondary | ICD-10-CM | POA: Diagnosis not present

## 2016-08-26 DIAGNOSIS — R262 Difficulty in walking, not elsewhere classified: Secondary | ICD-10-CM | POA: Diagnosis not present

## 2016-08-26 DIAGNOSIS — F419 Anxiety disorder, unspecified: Secondary | ICD-10-CM | POA: Diagnosis not present

## 2016-08-27 DIAGNOSIS — R262 Difficulty in walking, not elsewhere classified: Secondary | ICD-10-CM | POA: Diagnosis not present

## 2016-08-27 DIAGNOSIS — M6281 Muscle weakness (generalized): Secondary | ICD-10-CM | POA: Diagnosis not present

## 2016-08-27 DIAGNOSIS — R1312 Dysphagia, oropharyngeal phase: Secondary | ICD-10-CM | POA: Diagnosis not present

## 2016-08-27 DIAGNOSIS — F419 Anxiety disorder, unspecified: Secondary | ICD-10-CM | POA: Diagnosis not present

## 2016-08-28 DIAGNOSIS — F419 Anxiety disorder, unspecified: Secondary | ICD-10-CM | POA: Diagnosis not present

## 2016-08-28 DIAGNOSIS — M6281 Muscle weakness (generalized): Secondary | ICD-10-CM | POA: Diagnosis not present

## 2016-08-28 DIAGNOSIS — R262 Difficulty in walking, not elsewhere classified: Secondary | ICD-10-CM | POA: Diagnosis not present

## 2016-08-28 DIAGNOSIS — R1312 Dysphagia, oropharyngeal phase: Secondary | ICD-10-CM | POA: Diagnosis not present

## 2016-08-29 DIAGNOSIS — R1312 Dysphagia, oropharyngeal phase: Secondary | ICD-10-CM | POA: Diagnosis not present

## 2016-08-29 DIAGNOSIS — M6281 Muscle weakness (generalized): Secondary | ICD-10-CM | POA: Diagnosis not present

## 2016-08-29 DIAGNOSIS — R262 Difficulty in walking, not elsewhere classified: Secondary | ICD-10-CM | POA: Diagnosis not present

## 2016-08-29 DIAGNOSIS — F419 Anxiety disorder, unspecified: Secondary | ICD-10-CM | POA: Diagnosis not present

## 2016-08-31 DIAGNOSIS — F419 Anxiety disorder, unspecified: Secondary | ICD-10-CM | POA: Diagnosis not present

## 2016-08-31 DIAGNOSIS — R1312 Dysphagia, oropharyngeal phase: Secondary | ICD-10-CM | POA: Diagnosis not present

## 2016-08-31 DIAGNOSIS — M6281 Muscle weakness (generalized): Secondary | ICD-10-CM | POA: Diagnosis not present

## 2016-08-31 DIAGNOSIS — R262 Difficulty in walking, not elsewhere classified: Secondary | ICD-10-CM | POA: Diagnosis not present

## 2016-09-01 DIAGNOSIS — R1312 Dysphagia, oropharyngeal phase: Secondary | ICD-10-CM | POA: Diagnosis not present

## 2016-09-01 DIAGNOSIS — R262 Difficulty in walking, not elsewhere classified: Secondary | ICD-10-CM | POA: Diagnosis not present

## 2016-09-01 DIAGNOSIS — M6281 Muscle weakness (generalized): Secondary | ICD-10-CM | POA: Diagnosis not present

## 2016-09-01 DIAGNOSIS — F419 Anxiety disorder, unspecified: Secondary | ICD-10-CM | POA: Diagnosis not present

## 2016-09-02 DIAGNOSIS — F419 Anxiety disorder, unspecified: Secondary | ICD-10-CM | POA: Diagnosis not present

## 2016-09-02 DIAGNOSIS — R1312 Dysphagia, oropharyngeal phase: Secondary | ICD-10-CM | POA: Diagnosis not present

## 2016-09-02 DIAGNOSIS — R262 Difficulty in walking, not elsewhere classified: Secondary | ICD-10-CM | POA: Diagnosis not present

## 2016-09-02 DIAGNOSIS — M6281 Muscle weakness (generalized): Secondary | ICD-10-CM | POA: Diagnosis not present

## 2016-09-03 DIAGNOSIS — F419 Anxiety disorder, unspecified: Secondary | ICD-10-CM | POA: Diagnosis not present

## 2016-09-03 DIAGNOSIS — M6281 Muscle weakness (generalized): Secondary | ICD-10-CM | POA: Diagnosis not present

## 2016-09-03 DIAGNOSIS — R262 Difficulty in walking, not elsewhere classified: Secondary | ICD-10-CM | POA: Diagnosis not present

## 2016-09-03 DIAGNOSIS — R1312 Dysphagia, oropharyngeal phase: Secondary | ICD-10-CM | POA: Diagnosis not present

## 2016-09-04 DIAGNOSIS — M6281 Muscle weakness (generalized): Secondary | ICD-10-CM | POA: Diagnosis not present

## 2016-09-04 DIAGNOSIS — R262 Difficulty in walking, not elsewhere classified: Secondary | ICD-10-CM | POA: Diagnosis not present

## 2016-09-04 DIAGNOSIS — R1312 Dysphagia, oropharyngeal phase: Secondary | ICD-10-CM | POA: Diagnosis not present

## 2016-09-04 DIAGNOSIS — F419 Anxiety disorder, unspecified: Secondary | ICD-10-CM | POA: Diagnosis not present

## 2016-09-07 DIAGNOSIS — F419 Anxiety disorder, unspecified: Secondary | ICD-10-CM | POA: Diagnosis not present

## 2016-09-07 DIAGNOSIS — R1312 Dysphagia, oropharyngeal phase: Secondary | ICD-10-CM | POA: Diagnosis not present

## 2016-09-07 DIAGNOSIS — R262 Difficulty in walking, not elsewhere classified: Secondary | ICD-10-CM | POA: Diagnosis not present

## 2016-09-07 DIAGNOSIS — M6281 Muscle weakness (generalized): Secondary | ICD-10-CM | POA: Diagnosis not present

## 2016-09-08 DIAGNOSIS — R262 Difficulty in walking, not elsewhere classified: Secondary | ICD-10-CM | POA: Diagnosis not present

## 2016-09-08 DIAGNOSIS — F419 Anxiety disorder, unspecified: Secondary | ICD-10-CM | POA: Diagnosis not present

## 2016-09-08 DIAGNOSIS — R1312 Dysphagia, oropharyngeal phase: Secondary | ICD-10-CM | POA: Diagnosis not present

## 2016-09-08 DIAGNOSIS — M6281 Muscle weakness (generalized): Secondary | ICD-10-CM | POA: Diagnosis not present

## 2016-09-09 DIAGNOSIS — M6281 Muscle weakness (generalized): Secondary | ICD-10-CM | POA: Diagnosis not present

## 2016-09-09 DIAGNOSIS — R1312 Dysphagia, oropharyngeal phase: Secondary | ICD-10-CM | POA: Diagnosis not present

## 2016-09-09 DIAGNOSIS — R262 Difficulty in walking, not elsewhere classified: Secondary | ICD-10-CM | POA: Diagnosis not present

## 2016-09-09 DIAGNOSIS — F419 Anxiety disorder, unspecified: Secondary | ICD-10-CM | POA: Diagnosis not present

## 2016-09-10 DIAGNOSIS — M6281 Muscle weakness (generalized): Secondary | ICD-10-CM | POA: Diagnosis not present

## 2016-09-10 DIAGNOSIS — R1312 Dysphagia, oropharyngeal phase: Secondary | ICD-10-CM | POA: Diagnosis not present

## 2016-09-10 DIAGNOSIS — R262 Difficulty in walking, not elsewhere classified: Secondary | ICD-10-CM | POA: Diagnosis not present

## 2016-09-10 DIAGNOSIS — F419 Anxiety disorder, unspecified: Secondary | ICD-10-CM | POA: Diagnosis not present

## 2016-09-17 DIAGNOSIS — M6281 Muscle weakness (generalized): Secondary | ICD-10-CM | POA: Diagnosis not present

## 2016-09-17 DIAGNOSIS — R1312 Dysphagia, oropharyngeal phase: Secondary | ICD-10-CM | POA: Diagnosis not present

## 2016-09-17 DIAGNOSIS — R262 Difficulty in walking, not elsewhere classified: Secondary | ICD-10-CM | POA: Diagnosis not present

## 2016-09-17 DIAGNOSIS — F419 Anxiety disorder, unspecified: Secondary | ICD-10-CM | POA: Diagnosis not present

## 2016-09-18 DIAGNOSIS — F321 Major depressive disorder, single episode, moderate: Secondary | ICD-10-CM | POA: Diagnosis not present

## 2016-09-18 DIAGNOSIS — F419 Anxiety disorder, unspecified: Secondary | ICD-10-CM | POA: Diagnosis not present

## 2016-09-18 DIAGNOSIS — G20 Parkinson's disease: Secondary | ICD-10-CM | POA: Diagnosis not present

## 2016-09-18 DIAGNOSIS — F411 Generalized anxiety disorder: Secondary | ICD-10-CM | POA: Diagnosis not present

## 2016-09-18 DIAGNOSIS — M6281 Muscle weakness (generalized): Secondary | ICD-10-CM | POA: Diagnosis not present

## 2016-09-18 DIAGNOSIS — R262 Difficulty in walking, not elsewhere classified: Secondary | ICD-10-CM | POA: Diagnosis not present

## 2016-09-18 DIAGNOSIS — R1312 Dysphagia, oropharyngeal phase: Secondary | ICD-10-CM | POA: Diagnosis not present

## 2016-09-18 DIAGNOSIS — F0281 Dementia in other diseases classified elsewhere with behavioral disturbance: Secondary | ICD-10-CM | POA: Diagnosis not present

## 2016-09-19 DIAGNOSIS — M6281 Muscle weakness (generalized): Secondary | ICD-10-CM | POA: Diagnosis not present

## 2016-09-19 DIAGNOSIS — I10 Essential (primary) hypertension: Secondary | ICD-10-CM | POA: Diagnosis not present

## 2016-09-19 DIAGNOSIS — F419 Anxiety disorder, unspecified: Secondary | ICD-10-CM | POA: Diagnosis not present

## 2016-09-19 DIAGNOSIS — G301 Alzheimer's disease with late onset: Secondary | ICD-10-CM | POA: Diagnosis not present

## 2016-09-21 DIAGNOSIS — F419 Anxiety disorder, unspecified: Secondary | ICD-10-CM | POA: Diagnosis not present

## 2016-09-21 DIAGNOSIS — R262 Difficulty in walking, not elsewhere classified: Secondary | ICD-10-CM | POA: Diagnosis not present

## 2016-09-21 DIAGNOSIS — R1312 Dysphagia, oropharyngeal phase: Secondary | ICD-10-CM | POA: Diagnosis not present

## 2016-09-21 DIAGNOSIS — M6281 Muscle weakness (generalized): Secondary | ICD-10-CM | POA: Diagnosis not present

## 2016-09-22 DIAGNOSIS — R262 Difficulty in walking, not elsewhere classified: Secondary | ICD-10-CM | POA: Diagnosis not present

## 2016-09-22 DIAGNOSIS — R1312 Dysphagia, oropharyngeal phase: Secondary | ICD-10-CM | POA: Diagnosis not present

## 2016-09-22 DIAGNOSIS — F419 Anxiety disorder, unspecified: Secondary | ICD-10-CM | POA: Diagnosis not present

## 2016-09-22 DIAGNOSIS — M6281 Muscle weakness (generalized): Secondary | ICD-10-CM | POA: Diagnosis not present

## 2016-09-23 DIAGNOSIS — R262 Difficulty in walking, not elsewhere classified: Secondary | ICD-10-CM | POA: Diagnosis not present

## 2016-09-23 DIAGNOSIS — F419 Anxiety disorder, unspecified: Secondary | ICD-10-CM | POA: Diagnosis not present

## 2016-09-23 DIAGNOSIS — M6281 Muscle weakness (generalized): Secondary | ICD-10-CM | POA: Diagnosis not present

## 2016-09-23 DIAGNOSIS — R1312 Dysphagia, oropharyngeal phase: Secondary | ICD-10-CM | POA: Diagnosis not present

## 2016-09-24 DIAGNOSIS — R1312 Dysphagia, oropharyngeal phase: Secondary | ICD-10-CM | POA: Diagnosis not present

## 2016-09-24 DIAGNOSIS — I63541 Cerebral infarction due to unspecified occlusion or stenosis of right cerebellar artery: Secondary | ICD-10-CM | POA: Diagnosis not present

## 2016-09-24 DIAGNOSIS — F482 Pseudobulbar affect: Secondary | ICD-10-CM | POA: Diagnosis not present

## 2016-09-24 DIAGNOSIS — R1310 Dysphagia, unspecified: Secondary | ICD-10-CM | POA: Diagnosis not present

## 2016-09-24 DIAGNOSIS — M6281 Muscle weakness (generalized): Secondary | ICD-10-CM | POA: Diagnosis not present

## 2016-09-24 DIAGNOSIS — I10 Essential (primary) hypertension: Secondary | ICD-10-CM | POA: Diagnosis not present

## 2016-09-24 DIAGNOSIS — F419 Anxiety disorder, unspecified: Secondary | ICD-10-CM | POA: Diagnosis not present

## 2016-09-24 DIAGNOSIS — R262 Difficulty in walking, not elsewhere classified: Secondary | ICD-10-CM | POA: Diagnosis not present

## 2016-09-25 DIAGNOSIS — R1312 Dysphagia, oropharyngeal phase: Secondary | ICD-10-CM | POA: Diagnosis not present

## 2016-09-25 DIAGNOSIS — R262 Difficulty in walking, not elsewhere classified: Secondary | ICD-10-CM | POA: Diagnosis not present

## 2016-09-25 DIAGNOSIS — F419 Anxiety disorder, unspecified: Secondary | ICD-10-CM | POA: Diagnosis not present

## 2016-09-25 DIAGNOSIS — M6281 Muscle weakness (generalized): Secondary | ICD-10-CM | POA: Diagnosis not present

## 2016-09-28 DIAGNOSIS — R262 Difficulty in walking, not elsewhere classified: Secondary | ICD-10-CM | POA: Diagnosis not present

## 2016-09-28 DIAGNOSIS — R1312 Dysphagia, oropharyngeal phase: Secondary | ICD-10-CM | POA: Diagnosis not present

## 2016-09-28 DIAGNOSIS — M6281 Muscle weakness (generalized): Secondary | ICD-10-CM | POA: Diagnosis not present

## 2016-09-28 DIAGNOSIS — F419 Anxiety disorder, unspecified: Secondary | ICD-10-CM | POA: Diagnosis not present

## 2016-10-04 DIAGNOSIS — S72142A Displaced intertrochanteric fracture of left femur, initial encounter for closed fracture: Secondary | ICD-10-CM | POA: Diagnosis not present

## 2016-10-04 DIAGNOSIS — S42001A Fracture of unspecified part of right clavicle, initial encounter for closed fracture: Secondary | ICD-10-CM | POA: Diagnosis not present

## 2016-10-04 DIAGNOSIS — K30 Functional dyspepsia: Secondary | ICD-10-CM | POA: Diagnosis not present

## 2016-10-04 DIAGNOSIS — D62 Acute posthemorrhagic anemia: Secondary | ICD-10-CM | POA: Diagnosis not present

## 2016-10-04 DIAGNOSIS — S72002D Fracture of unspecified part of neck of left femur, subsequent encounter for closed fracture with routine healing: Secondary | ICD-10-CM | POA: Diagnosis not present

## 2016-10-04 DIAGNOSIS — S299XXA Unspecified injury of thorax, initial encounter: Secondary | ICD-10-CM | POA: Diagnosis not present

## 2016-10-04 DIAGNOSIS — F0281 Dementia in other diseases classified elsewhere with behavioral disturbance: Secondary | ICD-10-CM | POA: Diagnosis not present

## 2016-10-04 DIAGNOSIS — R410 Disorientation, unspecified: Secondary | ICD-10-CM | POA: Diagnosis not present

## 2016-10-04 DIAGNOSIS — Z01818 Encounter for other preprocedural examination: Secondary | ICD-10-CM | POA: Diagnosis not present

## 2016-10-04 DIAGNOSIS — I10 Essential (primary) hypertension: Secondary | ICD-10-CM | POA: Diagnosis present

## 2016-10-04 DIAGNOSIS — M6281 Muscle weakness (generalized): Secondary | ICD-10-CM | POA: Diagnosis not present

## 2016-10-04 DIAGNOSIS — M25552 Pain in left hip: Secondary | ICD-10-CM | POA: Diagnosis not present

## 2016-10-04 DIAGNOSIS — N390 Urinary tract infection, site not specified: Secondary | ICD-10-CM | POA: Diagnosis not present

## 2016-10-04 DIAGNOSIS — F0151 Vascular dementia with behavioral disturbance: Secondary | ICD-10-CM | POA: Diagnosis present

## 2016-10-04 DIAGNOSIS — S3992XA Unspecified injury of lower back, initial encounter: Secondary | ICD-10-CM | POA: Diagnosis not present

## 2016-10-04 DIAGNOSIS — S72002A Fracture of unspecified part of neck of left femur, initial encounter for closed fracture: Secondary | ICD-10-CM | POA: Diagnosis not present

## 2016-10-04 DIAGNOSIS — K219 Gastro-esophageal reflux disease without esophagitis: Secondary | ICD-10-CM | POA: Diagnosis not present

## 2016-10-04 DIAGNOSIS — S4991XA Unspecified injury of right shoulder and upper arm, initial encounter: Secondary | ICD-10-CM | POA: Diagnosis not present

## 2016-10-04 DIAGNOSIS — F05 Delirium due to known physiological condition: Secondary | ICD-10-CM | POA: Diagnosis not present

## 2016-10-04 DIAGNOSIS — F419 Anxiety disorder, unspecified: Secondary | ICD-10-CM | POA: Diagnosis present

## 2016-10-04 DIAGNOSIS — T83511A Infection and inflammatory reaction due to indwelling urethral catheter, initial encounter: Secondary | ICD-10-CM | POA: Diagnosis not present

## 2016-10-04 DIAGNOSIS — D469 Myelodysplastic syndrome, unspecified: Secondary | ICD-10-CM | POA: Diagnosis not present

## 2016-10-04 DIAGNOSIS — Z66 Do not resuscitate: Secondary | ICD-10-CM | POA: Diagnosis present

## 2016-10-04 DIAGNOSIS — R296 Repeated falls: Secondary | ICD-10-CM | POA: Diagnosis not present

## 2016-10-04 DIAGNOSIS — R509 Fever, unspecified: Secondary | ICD-10-CM | POA: Diagnosis not present

## 2016-10-04 DIAGNOSIS — F321 Major depressive disorder, single episode, moderate: Secondary | ICD-10-CM | POA: Diagnosis not present

## 2016-10-04 DIAGNOSIS — S199XXA Unspecified injury of neck, initial encounter: Secondary | ICD-10-CM | POA: Diagnosis not present

## 2016-10-04 DIAGNOSIS — Z8673 Personal history of transient ischemic attack (TIA), and cerebral infarction without residual deficits: Secondary | ICD-10-CM | POA: Diagnosis not present

## 2016-10-04 DIAGNOSIS — S72143A Displaced intertrochanteric fracture of unspecified femur, initial encounter for closed fracture: Secondary | ICD-10-CM | POA: Diagnosis not present

## 2016-10-04 DIAGNOSIS — S92153A Displaced avulsion fracture (chip fracture) of unspecified talus, initial encounter for closed fracture: Secondary | ICD-10-CM | POA: Diagnosis not present

## 2016-10-04 DIAGNOSIS — R269 Unspecified abnormalities of gait and mobility: Secondary | ICD-10-CM | POA: Diagnosis not present

## 2016-10-04 DIAGNOSIS — I483 Typical atrial flutter: Secondary | ICD-10-CM | POA: Diagnosis not present

## 2016-10-04 DIAGNOSIS — I63541 Cerebral infarction due to unspecified occlusion or stenosis of right cerebellar artery: Secondary | ICD-10-CM | POA: Diagnosis not present

## 2016-10-04 DIAGNOSIS — S79911A Unspecified injury of right hip, initial encounter: Secondary | ICD-10-CM | POA: Diagnosis not present

## 2016-10-04 DIAGNOSIS — Z79899 Other long term (current) drug therapy: Secondary | ICD-10-CM | POA: Diagnosis not present

## 2016-10-04 DIAGNOSIS — D508 Other iron deficiency anemias: Secondary | ICD-10-CM | POA: Diagnosis not present

## 2016-10-04 DIAGNOSIS — R5082 Postprocedural fever: Secondary | ICD-10-CM | POA: Diagnosis not present

## 2016-10-04 DIAGNOSIS — I69919 Unspecified symptoms and signs involving cognitive functions following unspecified cerebrovascular disease: Secondary | ICD-10-CM | POA: Diagnosis present

## 2016-10-04 DIAGNOSIS — S42001D Fracture of unspecified part of right clavicle, subsequent encounter for fracture with routine healing: Secondary | ICD-10-CM | POA: Diagnosis not present

## 2016-10-04 DIAGNOSIS — F482 Pseudobulbar affect: Secondary | ICD-10-CM | POA: Diagnosis not present

## 2016-10-04 DIAGNOSIS — R1312 Dysphagia, oropharyngeal phase: Secondary | ICD-10-CM | POA: Diagnosis not present

## 2016-10-04 DIAGNOSIS — F0390 Unspecified dementia without behavioral disturbance: Secondary | ICD-10-CM | POA: Diagnosis not present

## 2016-10-04 DIAGNOSIS — R262 Difficulty in walking, not elsewhere classified: Secondary | ICD-10-CM | POA: Diagnosis not present

## 2016-10-04 DIAGNOSIS — Z7982 Long term (current) use of aspirin: Secondary | ICD-10-CM | POA: Diagnosis not present

## 2016-10-04 DIAGNOSIS — F22 Delusional disorders: Secondary | ICD-10-CM | POA: Diagnosis not present

## 2016-10-07 DIAGNOSIS — Z01818 Encounter for other preprocedural examination: Secondary | ICD-10-CM

## 2016-10-10 DIAGNOSIS — S72002A Fracture of unspecified part of neck of left femur, initial encounter for closed fracture: Secondary | ICD-10-CM | POA: Diagnosis not present

## 2016-10-10 DIAGNOSIS — D469 Myelodysplastic syndrome, unspecified: Secondary | ICD-10-CM | POA: Diagnosis not present

## 2016-10-10 DIAGNOSIS — Z8673 Personal history of transient ischemic attack (TIA), and cerebral infarction without residual deficits: Secondary | ICD-10-CM | POA: Diagnosis not present

## 2016-10-10 DIAGNOSIS — F22 Delusional disorders: Secondary | ICD-10-CM | POA: Diagnosis not present

## 2016-10-10 DIAGNOSIS — F419 Anxiety disorder, unspecified: Secondary | ICD-10-CM | POA: Diagnosis not present

## 2016-10-10 DIAGNOSIS — D508 Other iron deficiency anemias: Secondary | ICD-10-CM | POA: Diagnosis not present

## 2016-10-10 DIAGNOSIS — F0281 Dementia in other diseases classified elsewhere with behavioral disturbance: Secondary | ICD-10-CM | POA: Diagnosis not present

## 2016-10-10 DIAGNOSIS — K219 Gastro-esophageal reflux disease without esophagitis: Secondary | ICD-10-CM | POA: Diagnosis not present

## 2016-10-10 DIAGNOSIS — N390 Urinary tract infection, site not specified: Secondary | ICD-10-CM | POA: Diagnosis not present

## 2016-10-10 DIAGNOSIS — R269 Unspecified abnormalities of gait and mobility: Secondary | ICD-10-CM | POA: Diagnosis not present

## 2016-10-10 DIAGNOSIS — F482 Pseudobulbar affect: Secondary | ICD-10-CM | POA: Diagnosis not present

## 2016-10-10 DIAGNOSIS — F0151 Vascular dementia with behavioral disturbance: Secondary | ICD-10-CM | POA: Diagnosis not present

## 2016-10-10 DIAGNOSIS — I483 Typical atrial flutter: Secondary | ICD-10-CM | POA: Diagnosis not present

## 2016-10-10 DIAGNOSIS — F411 Generalized anxiety disorder: Secondary | ICD-10-CM | POA: Diagnosis not present

## 2016-10-10 DIAGNOSIS — S92153A Displaced avulsion fracture (chip fracture) of unspecified talus, initial encounter for closed fracture: Secondary | ICD-10-CM | POA: Diagnosis not present

## 2016-10-10 DIAGNOSIS — M6281 Muscle weakness (generalized): Secondary | ICD-10-CM | POA: Diagnosis not present

## 2016-10-10 DIAGNOSIS — R1312 Dysphagia, oropharyngeal phase: Secondary | ICD-10-CM | POA: Diagnosis not present

## 2016-10-10 DIAGNOSIS — K30 Functional dyspepsia: Secondary | ICD-10-CM | POA: Diagnosis not present

## 2016-10-10 DIAGNOSIS — R7989 Other specified abnormal findings of blood chemistry: Secondary | ICD-10-CM | POA: Diagnosis not present

## 2016-10-10 DIAGNOSIS — S79911A Unspecified injury of right hip, initial encounter: Secondary | ICD-10-CM | POA: Diagnosis not present

## 2016-10-10 DIAGNOSIS — D649 Anemia, unspecified: Secondary | ICD-10-CM | POA: Diagnosis not present

## 2016-10-10 DIAGNOSIS — F0390 Unspecified dementia without behavioral disturbance: Secondary | ICD-10-CM | POA: Diagnosis not present

## 2016-10-10 DIAGNOSIS — S42001A Fracture of unspecified part of right clavicle, initial encounter for closed fracture: Secondary | ICD-10-CM | POA: Diagnosis not present

## 2016-10-10 DIAGNOSIS — S72142A Displaced intertrochanteric fracture of left femur, initial encounter for closed fracture: Secondary | ICD-10-CM | POA: Diagnosis not present

## 2016-10-10 DIAGNOSIS — F321 Major depressive disorder, single episode, moderate: Secondary | ICD-10-CM | POA: Diagnosis not present

## 2016-10-10 DIAGNOSIS — M25552 Pain in left hip: Secondary | ICD-10-CM | POA: Diagnosis not present

## 2016-10-10 DIAGNOSIS — S42001D Fracture of unspecified part of right clavicle, subsequent encounter for fracture with routine healing: Secondary | ICD-10-CM | POA: Diagnosis not present

## 2016-10-10 DIAGNOSIS — R410 Disorientation, unspecified: Secondary | ICD-10-CM | POA: Diagnosis not present

## 2016-10-10 DIAGNOSIS — I63541 Cerebral infarction due to unspecified occlusion or stenosis of right cerebellar artery: Secondary | ICD-10-CM | POA: Diagnosis not present

## 2016-10-10 DIAGNOSIS — G20 Parkinson's disease: Secondary | ICD-10-CM | POA: Diagnosis not present

## 2016-10-10 DIAGNOSIS — I10 Essential (primary) hypertension: Secondary | ICD-10-CM | POA: Diagnosis not present

## 2016-10-10 DIAGNOSIS — G301 Alzheimer's disease with late onset: Secondary | ICD-10-CM | POA: Diagnosis not present

## 2016-10-10 DIAGNOSIS — R262 Difficulty in walking, not elsewhere classified: Secondary | ICD-10-CM | POA: Diagnosis not present

## 2016-10-10 DIAGNOSIS — R296 Repeated falls: Secondary | ICD-10-CM | POA: Diagnosis not present

## 2016-10-10 DIAGNOSIS — Z79899 Other long term (current) drug therapy: Secondary | ICD-10-CM | POA: Diagnosis not present

## 2016-10-10 DIAGNOSIS — S72002D Fracture of unspecified part of neck of left femur, subsequent encounter for closed fracture with routine healing: Secondary | ICD-10-CM | POA: Diagnosis not present

## 2016-10-13 DIAGNOSIS — I10 Essential (primary) hypertension: Secondary | ICD-10-CM | POA: Diagnosis not present

## 2016-10-13 DIAGNOSIS — D508 Other iron deficiency anemias: Secondary | ICD-10-CM | POA: Diagnosis not present

## 2016-10-13 DIAGNOSIS — I483 Typical atrial flutter: Secondary | ICD-10-CM | POA: Diagnosis not present

## 2016-10-13 DIAGNOSIS — S72002D Fracture of unspecified part of neck of left femur, subsequent encounter for closed fracture with routine healing: Secondary | ICD-10-CM | POA: Diagnosis not present

## 2016-10-17 DIAGNOSIS — I10 Essential (primary) hypertension: Secondary | ICD-10-CM | POA: Diagnosis not present

## 2016-10-17 DIAGNOSIS — D508 Other iron deficiency anemias: Secondary | ICD-10-CM | POA: Diagnosis not present

## 2016-10-17 DIAGNOSIS — F419 Anxiety disorder, unspecified: Secondary | ICD-10-CM | POA: Diagnosis not present

## 2016-10-17 DIAGNOSIS — M6281 Muscle weakness (generalized): Secondary | ICD-10-CM | POA: Diagnosis not present

## 2016-10-21 DIAGNOSIS — S72142A Displaced intertrochanteric fracture of left femur, initial encounter for closed fracture: Secondary | ICD-10-CM | POA: Diagnosis not present

## 2016-10-22 DIAGNOSIS — I63541 Cerebral infarction due to unspecified occlusion or stenosis of right cerebellar artery: Secondary | ICD-10-CM | POA: Diagnosis not present

## 2016-10-22 DIAGNOSIS — I10 Essential (primary) hypertension: Secondary | ICD-10-CM | POA: Diagnosis not present

## 2016-10-22 DIAGNOSIS — D508 Other iron deficiency anemias: Secondary | ICD-10-CM | POA: Diagnosis not present

## 2016-10-22 DIAGNOSIS — I483 Typical atrial flutter: Secondary | ICD-10-CM | POA: Diagnosis not present

## 2016-11-06 DIAGNOSIS — F321 Major depressive disorder, single episode, moderate: Secondary | ICD-10-CM | POA: Diagnosis not present

## 2016-11-06 DIAGNOSIS — G20 Parkinson's disease: Secondary | ICD-10-CM | POA: Diagnosis not present

## 2016-11-06 DIAGNOSIS — F0281 Dementia in other diseases classified elsewhere with behavioral disturbance: Secondary | ICD-10-CM | POA: Diagnosis not present

## 2016-11-06 DIAGNOSIS — F411 Generalized anxiety disorder: Secondary | ICD-10-CM | POA: Diagnosis not present

## 2016-11-18 DIAGNOSIS — S72142A Displaced intertrochanteric fracture of left femur, initial encounter for closed fracture: Secondary | ICD-10-CM | POA: Diagnosis not present

## 2016-11-19 DIAGNOSIS — F482 Pseudobulbar affect: Secondary | ICD-10-CM | POA: Diagnosis not present

## 2016-11-19 DIAGNOSIS — I10 Essential (primary) hypertension: Secondary | ICD-10-CM | POA: Diagnosis not present

## 2016-11-19 DIAGNOSIS — G301 Alzheimer's disease with late onset: Secondary | ICD-10-CM | POA: Diagnosis not present

## 2016-11-19 DIAGNOSIS — D508 Other iron deficiency anemias: Secondary | ICD-10-CM | POA: Diagnosis not present

## 2016-11-28 DIAGNOSIS — F419 Anxiety disorder, unspecified: Secondary | ICD-10-CM | POA: Diagnosis not present

## 2016-11-28 DIAGNOSIS — I10 Essential (primary) hypertension: Secondary | ICD-10-CM | POA: Diagnosis not present

## 2016-11-28 DIAGNOSIS — M6281 Muscle weakness (generalized): Secondary | ICD-10-CM | POA: Diagnosis not present

## 2016-12-08 DIAGNOSIS — D508 Other iron deficiency anemias: Secondary | ICD-10-CM | POA: Diagnosis not present

## 2016-12-08 DIAGNOSIS — I10 Essential (primary) hypertension: Secondary | ICD-10-CM | POA: Diagnosis not present

## 2016-12-08 DIAGNOSIS — I63541 Cerebral infarction due to unspecified occlusion or stenosis of right cerebellar artery: Secondary | ICD-10-CM | POA: Diagnosis not present

## 2016-12-08 DIAGNOSIS — F0151 Vascular dementia with behavioral disturbance: Secondary | ICD-10-CM | POA: Diagnosis not present

## 2016-12-15 DIAGNOSIS — I63541 Cerebral infarction due to unspecified occlusion or stenosis of right cerebellar artery: Secondary | ICD-10-CM | POA: Diagnosis not present

## 2016-12-15 DIAGNOSIS — I10 Essential (primary) hypertension: Secondary | ICD-10-CM | POA: Diagnosis not present

## 2016-12-15 DIAGNOSIS — F482 Pseudobulbar affect: Secondary | ICD-10-CM | POA: Diagnosis not present

## 2016-12-15 DIAGNOSIS — D508 Other iron deficiency anemias: Secondary | ICD-10-CM | POA: Diagnosis not present

## 2016-12-16 DIAGNOSIS — Z79899 Other long term (current) drug therapy: Secondary | ICD-10-CM | POA: Diagnosis not present

## 2016-12-16 DIAGNOSIS — E559 Vitamin D deficiency, unspecified: Secondary | ICD-10-CM | POA: Diagnosis not present

## 2016-12-16 DIAGNOSIS — N390 Urinary tract infection, site not specified: Secondary | ICD-10-CM | POA: Diagnosis not present

## 2016-12-16 DIAGNOSIS — D649 Anemia, unspecified: Secondary | ICD-10-CM | POA: Diagnosis not present

## 2016-12-16 DIAGNOSIS — R7989 Other specified abnormal findings of blood chemistry: Secondary | ICD-10-CM | POA: Diagnosis not present

## 2016-12-29 DIAGNOSIS — R1312 Dysphagia, oropharyngeal phase: Secondary | ICD-10-CM | POA: Diagnosis not present

## 2016-12-29 DIAGNOSIS — R262 Difficulty in walking, not elsewhere classified: Secondary | ICD-10-CM | POA: Diagnosis not present

## 2016-12-29 DIAGNOSIS — S42001D Fracture of unspecified part of right clavicle, subsequent encounter for fracture with routine healing: Secondary | ICD-10-CM | POA: Diagnosis not present

## 2016-12-29 DIAGNOSIS — M6281 Muscle weakness (generalized): Secondary | ICD-10-CM | POA: Diagnosis not present

## 2016-12-29 DIAGNOSIS — M25552 Pain in left hip: Secondary | ICD-10-CM | POA: Diagnosis not present

## 2016-12-30 DIAGNOSIS — S72142A Displaced intertrochanteric fracture of left femur, initial encounter for closed fracture: Secondary | ICD-10-CM | POA: Diagnosis not present

## 2017-01-01 DIAGNOSIS — F321 Major depressive disorder, single episode, moderate: Secondary | ICD-10-CM | POA: Diagnosis not present

## 2017-01-01 DIAGNOSIS — F411 Generalized anxiety disorder: Secondary | ICD-10-CM | POA: Diagnosis not present

## 2017-01-01 DIAGNOSIS — F0281 Dementia in other diseases classified elsewhere with behavioral disturbance: Secondary | ICD-10-CM | POA: Diagnosis not present

## 2017-01-12 DIAGNOSIS — F482 Pseudobulbar affect: Secondary | ICD-10-CM | POA: Diagnosis not present

## 2017-01-12 DIAGNOSIS — R9431 Abnormal electrocardiogram [ECG] [EKG]: Secondary | ICD-10-CM | POA: Diagnosis not present

## 2017-01-12 DIAGNOSIS — I63541 Cerebral infarction due to unspecified occlusion or stenosis of right cerebellar artery: Secondary | ICD-10-CM | POA: Diagnosis not present

## 2017-01-12 DIAGNOSIS — I10 Essential (primary) hypertension: Secondary | ICD-10-CM | POA: Diagnosis not present

## 2017-01-14 DIAGNOSIS — I63541 Cerebral infarction due to unspecified occlusion or stenosis of right cerebellar artery: Secondary | ICD-10-CM | POA: Diagnosis not present

## 2017-01-14 DIAGNOSIS — I10 Essential (primary) hypertension: Secondary | ICD-10-CM | POA: Diagnosis not present

## 2017-01-14 DIAGNOSIS — R1310 Dysphagia, unspecified: Secondary | ICD-10-CM | POA: Diagnosis not present

## 2017-01-14 DIAGNOSIS — F419 Anxiety disorder, unspecified: Secondary | ICD-10-CM | POA: Diagnosis not present

## 2017-02-03 DIAGNOSIS — S42001D Fracture of unspecified part of right clavicle, subsequent encounter for fracture with routine healing: Secondary | ICD-10-CM | POA: Diagnosis not present

## 2017-02-03 DIAGNOSIS — M25552 Pain in left hip: Secondary | ICD-10-CM | POA: Diagnosis not present

## 2017-02-03 DIAGNOSIS — M6281 Muscle weakness (generalized): Secondary | ICD-10-CM | POA: Diagnosis not present

## 2017-02-03 DIAGNOSIS — R1312 Dysphagia, oropharyngeal phase: Secondary | ICD-10-CM | POA: Diagnosis not present

## 2017-02-03 DIAGNOSIS — R262 Difficulty in walking, not elsewhere classified: Secondary | ICD-10-CM | POA: Diagnosis not present

## 2017-02-04 DIAGNOSIS — M6281 Muscle weakness (generalized): Secondary | ICD-10-CM | POA: Diagnosis not present

## 2017-02-04 DIAGNOSIS — R569 Unspecified convulsions: Secondary | ICD-10-CM | POA: Diagnosis not present

## 2017-02-04 DIAGNOSIS — R262 Difficulty in walking, not elsewhere classified: Secondary | ICD-10-CM | POA: Diagnosis not present

## 2017-02-04 DIAGNOSIS — R1312 Dysphagia, oropharyngeal phase: Secondary | ICD-10-CM | POA: Diagnosis not present

## 2017-02-04 DIAGNOSIS — S42001D Fracture of unspecified part of right clavicle, subsequent encounter for fracture with routine healing: Secondary | ICD-10-CM | POA: Diagnosis not present

## 2017-02-04 DIAGNOSIS — M25552 Pain in left hip: Secondary | ICD-10-CM | POA: Diagnosis not present

## 2017-02-04 DIAGNOSIS — R7989 Other specified abnormal findings of blood chemistry: Secondary | ICD-10-CM | POA: Diagnosis not present

## 2017-02-05 DIAGNOSIS — R1312 Dysphagia, oropharyngeal phase: Secondary | ICD-10-CM | POA: Diagnosis not present

## 2017-02-05 DIAGNOSIS — M25552 Pain in left hip: Secondary | ICD-10-CM | POA: Diagnosis not present

## 2017-02-05 DIAGNOSIS — S42001D Fracture of unspecified part of right clavicle, subsequent encounter for fracture with routine healing: Secondary | ICD-10-CM | POA: Diagnosis not present

## 2017-02-05 DIAGNOSIS — R262 Difficulty in walking, not elsewhere classified: Secondary | ICD-10-CM | POA: Diagnosis not present

## 2017-02-05 DIAGNOSIS — M6281 Muscle weakness (generalized): Secondary | ICD-10-CM | POA: Diagnosis not present

## 2017-02-08 DIAGNOSIS — M25552 Pain in left hip: Secondary | ICD-10-CM | POA: Diagnosis not present

## 2017-02-08 DIAGNOSIS — R1312 Dysphagia, oropharyngeal phase: Secondary | ICD-10-CM | POA: Diagnosis not present

## 2017-02-08 DIAGNOSIS — R262 Difficulty in walking, not elsewhere classified: Secondary | ICD-10-CM | POA: Diagnosis not present

## 2017-02-08 DIAGNOSIS — M6281 Muscle weakness (generalized): Secondary | ICD-10-CM | POA: Diagnosis not present

## 2017-02-08 DIAGNOSIS — S42001D Fracture of unspecified part of right clavicle, subsequent encounter for fracture with routine healing: Secondary | ICD-10-CM | POA: Diagnosis not present

## 2017-02-09 DIAGNOSIS — R262 Difficulty in walking, not elsewhere classified: Secondary | ICD-10-CM | POA: Diagnosis not present

## 2017-02-09 DIAGNOSIS — M25552 Pain in left hip: Secondary | ICD-10-CM | POA: Diagnosis not present

## 2017-02-09 DIAGNOSIS — S42001D Fracture of unspecified part of right clavicle, subsequent encounter for fracture with routine healing: Secondary | ICD-10-CM | POA: Diagnosis not present

## 2017-02-09 DIAGNOSIS — M6281 Muscle weakness (generalized): Secondary | ICD-10-CM | POA: Diagnosis not present

## 2017-02-09 DIAGNOSIS — G301 Alzheimer's disease with late onset: Secondary | ICD-10-CM | POA: Diagnosis not present

## 2017-02-09 DIAGNOSIS — R1312 Dysphagia, oropharyngeal phase: Secondary | ICD-10-CM | POA: Diagnosis not present

## 2017-02-09 DIAGNOSIS — F419 Anxiety disorder, unspecified: Secondary | ICD-10-CM | POA: Diagnosis not present

## 2017-02-09 DIAGNOSIS — I63541 Cerebral infarction due to unspecified occlusion or stenosis of right cerebellar artery: Secondary | ICD-10-CM | POA: Diagnosis not present

## 2017-02-10 DIAGNOSIS — M25552 Pain in left hip: Secondary | ICD-10-CM | POA: Diagnosis not present

## 2017-02-10 DIAGNOSIS — S42001D Fracture of unspecified part of right clavicle, subsequent encounter for fracture with routine healing: Secondary | ICD-10-CM | POA: Diagnosis not present

## 2017-02-10 DIAGNOSIS — R262 Difficulty in walking, not elsewhere classified: Secondary | ICD-10-CM | POA: Diagnosis not present

## 2017-02-10 DIAGNOSIS — M6281 Muscle weakness (generalized): Secondary | ICD-10-CM | POA: Diagnosis not present

## 2017-02-10 DIAGNOSIS — R7989 Other specified abnormal findings of blood chemistry: Secondary | ICD-10-CM | POA: Diagnosis not present

## 2017-02-10 DIAGNOSIS — E039 Hypothyroidism, unspecified: Secondary | ICD-10-CM | POA: Diagnosis not present

## 2017-02-10 DIAGNOSIS — E119 Type 2 diabetes mellitus without complications: Secondary | ICD-10-CM | POA: Diagnosis not present

## 2017-02-10 DIAGNOSIS — E785 Hyperlipidemia, unspecified: Secondary | ICD-10-CM | POA: Diagnosis not present

## 2017-02-10 DIAGNOSIS — R1312 Dysphagia, oropharyngeal phase: Secondary | ICD-10-CM | POA: Diagnosis not present

## 2017-02-11 DIAGNOSIS — R1312 Dysphagia, oropharyngeal phase: Secondary | ICD-10-CM | POA: Diagnosis not present

## 2017-02-11 DIAGNOSIS — S42001D Fracture of unspecified part of right clavicle, subsequent encounter for fracture with routine healing: Secondary | ICD-10-CM | POA: Diagnosis not present

## 2017-02-11 DIAGNOSIS — M6281 Muscle weakness (generalized): Secondary | ICD-10-CM | POA: Diagnosis not present

## 2017-02-11 DIAGNOSIS — M25552 Pain in left hip: Secondary | ICD-10-CM | POA: Diagnosis not present

## 2017-02-11 DIAGNOSIS — R262 Difficulty in walking, not elsewhere classified: Secondary | ICD-10-CM | POA: Diagnosis not present

## 2017-02-12 DIAGNOSIS — F0281 Dementia in other diseases classified elsewhere with behavioral disturbance: Secondary | ICD-10-CM | POA: Diagnosis not present

## 2017-02-12 DIAGNOSIS — R262 Difficulty in walking, not elsewhere classified: Secondary | ICD-10-CM | POA: Diagnosis not present

## 2017-02-12 DIAGNOSIS — R1312 Dysphagia, oropharyngeal phase: Secondary | ICD-10-CM | POA: Diagnosis not present

## 2017-02-12 DIAGNOSIS — M6281 Muscle weakness (generalized): Secondary | ICD-10-CM | POA: Diagnosis not present

## 2017-02-12 DIAGNOSIS — F411 Generalized anxiety disorder: Secondary | ICD-10-CM | POA: Diagnosis not present

## 2017-02-12 DIAGNOSIS — S42001D Fracture of unspecified part of right clavicle, subsequent encounter for fracture with routine healing: Secondary | ICD-10-CM | POA: Diagnosis not present

## 2017-02-12 DIAGNOSIS — F321 Major depressive disorder, single episode, moderate: Secondary | ICD-10-CM | POA: Diagnosis not present

## 2017-02-12 DIAGNOSIS — M25552 Pain in left hip: Secondary | ICD-10-CM | POA: Diagnosis not present

## 2017-02-15 DIAGNOSIS — R262 Difficulty in walking, not elsewhere classified: Secondary | ICD-10-CM | POA: Diagnosis not present

## 2017-02-15 DIAGNOSIS — R1312 Dysphagia, oropharyngeal phase: Secondary | ICD-10-CM | POA: Diagnosis not present

## 2017-02-15 DIAGNOSIS — S42001D Fracture of unspecified part of right clavicle, subsequent encounter for fracture with routine healing: Secondary | ICD-10-CM | POA: Diagnosis not present

## 2017-02-15 DIAGNOSIS — M6281 Muscle weakness (generalized): Secondary | ICD-10-CM | POA: Diagnosis not present

## 2017-02-15 DIAGNOSIS — M25552 Pain in left hip: Secondary | ICD-10-CM | POA: Diagnosis not present

## 2017-02-16 DIAGNOSIS — R262 Difficulty in walking, not elsewhere classified: Secondary | ICD-10-CM | POA: Diagnosis not present

## 2017-02-16 DIAGNOSIS — M6281 Muscle weakness (generalized): Secondary | ICD-10-CM | POA: Diagnosis not present

## 2017-02-16 DIAGNOSIS — M25552 Pain in left hip: Secondary | ICD-10-CM | POA: Diagnosis not present

## 2017-02-16 DIAGNOSIS — R1312 Dysphagia, oropharyngeal phase: Secondary | ICD-10-CM | POA: Diagnosis not present

## 2017-02-16 DIAGNOSIS — S42001D Fracture of unspecified part of right clavicle, subsequent encounter for fracture with routine healing: Secondary | ICD-10-CM | POA: Diagnosis not present

## 2017-02-17 DIAGNOSIS — M6281 Muscle weakness (generalized): Secondary | ICD-10-CM | POA: Diagnosis not present

## 2017-02-17 DIAGNOSIS — S42001D Fracture of unspecified part of right clavicle, subsequent encounter for fracture with routine healing: Secondary | ICD-10-CM | POA: Diagnosis not present

## 2017-02-17 DIAGNOSIS — R1312 Dysphagia, oropharyngeal phase: Secondary | ICD-10-CM | POA: Diagnosis not present

## 2017-02-17 DIAGNOSIS — M25552 Pain in left hip: Secondary | ICD-10-CM | POA: Diagnosis not present

## 2017-02-17 DIAGNOSIS — R262 Difficulty in walking, not elsewhere classified: Secondary | ICD-10-CM | POA: Diagnosis not present

## 2017-02-26 DIAGNOSIS — M6281 Muscle weakness (generalized): Secondary | ICD-10-CM | POA: Diagnosis not present

## 2017-02-26 DIAGNOSIS — M25552 Pain in left hip: Secondary | ICD-10-CM | POA: Diagnosis not present

## 2017-02-26 DIAGNOSIS — R1312 Dysphagia, oropharyngeal phase: Secondary | ICD-10-CM | POA: Diagnosis not present

## 2017-02-26 DIAGNOSIS — S42001D Fracture of unspecified part of right clavicle, subsequent encounter for fracture with routine healing: Secondary | ICD-10-CM | POA: Diagnosis not present

## 2017-02-26 DIAGNOSIS — R262 Difficulty in walking, not elsewhere classified: Secondary | ICD-10-CM | POA: Diagnosis not present

## 2017-02-28 DIAGNOSIS — M25552 Pain in left hip: Secondary | ICD-10-CM | POA: Diagnosis not present

## 2017-02-28 DIAGNOSIS — M6281 Muscle weakness (generalized): Secondary | ICD-10-CM | POA: Diagnosis not present

## 2017-02-28 DIAGNOSIS — R262 Difficulty in walking, not elsewhere classified: Secondary | ICD-10-CM | POA: Diagnosis not present

## 2017-02-28 DIAGNOSIS — S42001D Fracture of unspecified part of right clavicle, subsequent encounter for fracture with routine healing: Secondary | ICD-10-CM | POA: Diagnosis not present

## 2017-02-28 DIAGNOSIS — R1312 Dysphagia, oropharyngeal phase: Secondary | ICD-10-CM | POA: Diagnosis not present

## 2017-03-02 DIAGNOSIS — R1312 Dysphagia, oropharyngeal phase: Secondary | ICD-10-CM | POA: Diagnosis not present

## 2017-03-02 DIAGNOSIS — M6281 Muscle weakness (generalized): Secondary | ICD-10-CM | POA: Diagnosis not present

## 2017-03-02 DIAGNOSIS — M25552 Pain in left hip: Secondary | ICD-10-CM | POA: Diagnosis not present

## 2017-03-02 DIAGNOSIS — R262 Difficulty in walking, not elsewhere classified: Secondary | ICD-10-CM | POA: Diagnosis not present

## 2017-03-02 DIAGNOSIS — S42001D Fracture of unspecified part of right clavicle, subsequent encounter for fracture with routine healing: Secondary | ICD-10-CM | POA: Diagnosis not present

## 2017-03-03 DIAGNOSIS — M6281 Muscle weakness (generalized): Secondary | ICD-10-CM | POA: Diagnosis not present

## 2017-03-03 DIAGNOSIS — M25552 Pain in left hip: Secondary | ICD-10-CM | POA: Diagnosis not present

## 2017-03-03 DIAGNOSIS — I63541 Cerebral infarction due to unspecified occlusion or stenosis of right cerebellar artery: Secondary | ICD-10-CM | POA: Diagnosis not present

## 2017-03-03 DIAGNOSIS — I10 Essential (primary) hypertension: Secondary | ICD-10-CM | POA: Diagnosis not present

## 2017-03-03 DIAGNOSIS — G301 Alzheimer's disease with late onset: Secondary | ICD-10-CM | POA: Diagnosis not present

## 2017-03-03 DIAGNOSIS — R1312 Dysphagia, oropharyngeal phase: Secondary | ICD-10-CM | POA: Diagnosis not present

## 2017-03-03 DIAGNOSIS — R262 Difficulty in walking, not elsewhere classified: Secondary | ICD-10-CM | POA: Diagnosis not present

## 2017-03-03 DIAGNOSIS — S42001D Fracture of unspecified part of right clavicle, subsequent encounter for fracture with routine healing: Secondary | ICD-10-CM | POA: Diagnosis not present

## 2017-03-04 DIAGNOSIS — M6281 Muscle weakness (generalized): Secondary | ICD-10-CM | POA: Diagnosis not present

## 2017-03-04 DIAGNOSIS — S42001D Fracture of unspecified part of right clavicle, subsequent encounter for fracture with routine healing: Secondary | ICD-10-CM | POA: Diagnosis not present

## 2017-03-04 DIAGNOSIS — R262 Difficulty in walking, not elsewhere classified: Secondary | ICD-10-CM | POA: Diagnosis not present

## 2017-03-04 DIAGNOSIS — R1312 Dysphagia, oropharyngeal phase: Secondary | ICD-10-CM | POA: Diagnosis not present

## 2017-03-04 DIAGNOSIS — M25552 Pain in left hip: Secondary | ICD-10-CM | POA: Diagnosis not present

## 2017-03-05 DIAGNOSIS — R1312 Dysphagia, oropharyngeal phase: Secondary | ICD-10-CM | POA: Diagnosis not present

## 2017-03-05 DIAGNOSIS — M6281 Muscle weakness (generalized): Secondary | ICD-10-CM | POA: Diagnosis not present

## 2017-03-05 DIAGNOSIS — M25552 Pain in left hip: Secondary | ICD-10-CM | POA: Diagnosis not present

## 2017-03-05 DIAGNOSIS — S42001D Fracture of unspecified part of right clavicle, subsequent encounter for fracture with routine healing: Secondary | ICD-10-CM | POA: Diagnosis not present

## 2017-03-05 DIAGNOSIS — R262 Difficulty in walking, not elsewhere classified: Secondary | ICD-10-CM | POA: Diagnosis not present

## 2017-03-08 DIAGNOSIS — G301 Alzheimer's disease with late onset: Secondary | ICD-10-CM | POA: Diagnosis not present

## 2017-03-08 DIAGNOSIS — I10 Essential (primary) hypertension: Secondary | ICD-10-CM | POA: Diagnosis not present

## 2017-03-08 DIAGNOSIS — M6281 Muscle weakness (generalized): Secondary | ICD-10-CM | POA: Diagnosis not present

## 2017-03-08 DIAGNOSIS — I63541 Cerebral infarction due to unspecified occlusion or stenosis of right cerebellar artery: Secondary | ICD-10-CM | POA: Diagnosis not present

## 2017-03-15 DIAGNOSIS — Z23 Encounter for immunization: Secondary | ICD-10-CM | POA: Diagnosis not present

## 2017-03-17 DIAGNOSIS — G301 Alzheimer's disease with late onset: Secondary | ICD-10-CM | POA: Diagnosis not present

## 2017-03-17 DIAGNOSIS — H109 Unspecified conjunctivitis: Secondary | ICD-10-CM | POA: Diagnosis not present

## 2017-03-26 DIAGNOSIS — F411 Generalized anxiety disorder: Secondary | ICD-10-CM | POA: Diagnosis not present

## 2017-03-26 DIAGNOSIS — F321 Major depressive disorder, single episode, moderate: Secondary | ICD-10-CM | POA: Diagnosis not present

## 2017-03-26 DIAGNOSIS — F0281 Dementia in other diseases classified elsewhere with behavioral disturbance: Secondary | ICD-10-CM | POA: Diagnosis not present

## 2017-03-31 DIAGNOSIS — E039 Hypothyroidism, unspecified: Secondary | ICD-10-CM | POA: Diagnosis not present

## 2017-04-01 DIAGNOSIS — I10 Essential (primary) hypertension: Secondary | ICD-10-CM | POA: Diagnosis not present

## 2017-04-01 DIAGNOSIS — I63541 Cerebral infarction due to unspecified occlusion or stenosis of right cerebellar artery: Secondary | ICD-10-CM | POA: Diagnosis not present

## 2017-04-01 DIAGNOSIS — M6281 Muscle weakness (generalized): Secondary | ICD-10-CM | POA: Diagnosis not present

## 2017-04-01 DIAGNOSIS — F419 Anxiety disorder, unspecified: Secondary | ICD-10-CM | POA: Diagnosis not present

## 2017-04-01 DIAGNOSIS — S72142A Displaced intertrochanteric fracture of left femur, initial encounter for closed fracture: Secondary | ICD-10-CM | POA: Diagnosis not present

## 2017-04-02 DIAGNOSIS — G301 Alzheimer's disease with late onset: Secondary | ICD-10-CM | POA: Diagnosis not present

## 2017-04-02 DIAGNOSIS — I63541 Cerebral infarction due to unspecified occlusion or stenosis of right cerebellar artery: Secondary | ICD-10-CM | POA: Diagnosis not present

## 2017-04-02 DIAGNOSIS — M6281 Muscle weakness (generalized): Secondary | ICD-10-CM | POA: Diagnosis not present

## 2017-04-02 DIAGNOSIS — R269 Unspecified abnormalities of gait and mobility: Secondary | ICD-10-CM | POA: Diagnosis not present

## 2017-04-11 DIAGNOSIS — F0151 Vascular dementia with behavioral disturbance: Secondary | ICD-10-CM | POA: Diagnosis not present

## 2017-04-11 DIAGNOSIS — M6281 Muscle weakness (generalized): Secondary | ICD-10-CM | POA: Diagnosis not present

## 2017-04-11 DIAGNOSIS — I63541 Cerebral infarction due to unspecified occlusion or stenosis of right cerebellar artery: Secondary | ICD-10-CM | POA: Diagnosis not present

## 2017-04-11 DIAGNOSIS — I10 Essential (primary) hypertension: Secondary | ICD-10-CM | POA: Diagnosis not present

## 2017-04-19 DIAGNOSIS — M6281 Muscle weakness (generalized): Secondary | ICD-10-CM | POA: Diagnosis not present

## 2017-04-19 DIAGNOSIS — M25552 Pain in left hip: Secondary | ICD-10-CM | POA: Diagnosis not present

## 2017-04-19 DIAGNOSIS — R1312 Dysphagia, oropharyngeal phase: Secondary | ICD-10-CM | POA: Diagnosis not present

## 2017-04-19 DIAGNOSIS — R262 Difficulty in walking, not elsewhere classified: Secondary | ICD-10-CM | POA: Diagnosis not present

## 2017-04-19 DIAGNOSIS — S42001D Fracture of unspecified part of right clavicle, subsequent encounter for fracture with routine healing: Secondary | ICD-10-CM | POA: Diagnosis not present

## 2017-04-20 DIAGNOSIS — R1312 Dysphagia, oropharyngeal phase: Secondary | ICD-10-CM | POA: Diagnosis not present

## 2017-04-20 DIAGNOSIS — R262 Difficulty in walking, not elsewhere classified: Secondary | ICD-10-CM | POA: Diagnosis not present

## 2017-04-20 DIAGNOSIS — M25552 Pain in left hip: Secondary | ICD-10-CM | POA: Diagnosis not present

## 2017-04-20 DIAGNOSIS — M6281 Muscle weakness (generalized): Secondary | ICD-10-CM | POA: Diagnosis not present

## 2017-04-20 DIAGNOSIS — S42001D Fracture of unspecified part of right clavicle, subsequent encounter for fracture with routine healing: Secondary | ICD-10-CM | POA: Diagnosis not present

## 2017-04-22 DIAGNOSIS — S42001D Fracture of unspecified part of right clavicle, subsequent encounter for fracture with routine healing: Secondary | ICD-10-CM | POA: Diagnosis not present

## 2017-04-22 DIAGNOSIS — R1312 Dysphagia, oropharyngeal phase: Secondary | ICD-10-CM | POA: Diagnosis not present

## 2017-04-22 DIAGNOSIS — R262 Difficulty in walking, not elsewhere classified: Secondary | ICD-10-CM | POA: Diagnosis not present

## 2017-04-22 DIAGNOSIS — M25552 Pain in left hip: Secondary | ICD-10-CM | POA: Diagnosis not present

## 2017-04-22 DIAGNOSIS — M6281 Muscle weakness (generalized): Secondary | ICD-10-CM | POA: Diagnosis not present

## 2017-04-23 DIAGNOSIS — R262 Difficulty in walking, not elsewhere classified: Secondary | ICD-10-CM | POA: Diagnosis not present

## 2017-04-23 DIAGNOSIS — S42001D Fracture of unspecified part of right clavicle, subsequent encounter for fracture with routine healing: Secondary | ICD-10-CM | POA: Diagnosis not present

## 2017-04-23 DIAGNOSIS — M6281 Muscle weakness (generalized): Secondary | ICD-10-CM | POA: Diagnosis not present

## 2017-04-23 DIAGNOSIS — M25552 Pain in left hip: Secondary | ICD-10-CM | POA: Diagnosis not present

## 2017-04-23 DIAGNOSIS — R1312 Dysphagia, oropharyngeal phase: Secondary | ICD-10-CM | POA: Diagnosis not present

## 2017-04-24 DIAGNOSIS — R1312 Dysphagia, oropharyngeal phase: Secondary | ICD-10-CM | POA: Diagnosis not present

## 2017-04-24 DIAGNOSIS — R262 Difficulty in walking, not elsewhere classified: Secondary | ICD-10-CM | POA: Diagnosis not present

## 2017-04-24 DIAGNOSIS — M25552 Pain in left hip: Secondary | ICD-10-CM | POA: Diagnosis not present

## 2017-04-24 DIAGNOSIS — S42001D Fracture of unspecified part of right clavicle, subsequent encounter for fracture with routine healing: Secondary | ICD-10-CM | POA: Diagnosis not present

## 2017-04-24 DIAGNOSIS — M6281 Muscle weakness (generalized): Secondary | ICD-10-CM | POA: Diagnosis not present

## 2017-04-26 DIAGNOSIS — M25552 Pain in left hip: Secondary | ICD-10-CM | POA: Diagnosis not present

## 2017-04-26 DIAGNOSIS — M6281 Muscle weakness (generalized): Secondary | ICD-10-CM | POA: Diagnosis not present

## 2017-04-26 DIAGNOSIS — R1312 Dysphagia, oropharyngeal phase: Secondary | ICD-10-CM | POA: Diagnosis not present

## 2017-04-26 DIAGNOSIS — R262 Difficulty in walking, not elsewhere classified: Secondary | ICD-10-CM | POA: Diagnosis not present

## 2017-04-26 DIAGNOSIS — S42001D Fracture of unspecified part of right clavicle, subsequent encounter for fracture with routine healing: Secondary | ICD-10-CM | POA: Diagnosis not present

## 2017-04-27 DIAGNOSIS — M6281 Muscle weakness (generalized): Secondary | ICD-10-CM | POA: Diagnosis not present

## 2017-04-27 DIAGNOSIS — M25552 Pain in left hip: Secondary | ICD-10-CM | POA: Diagnosis not present

## 2017-04-27 DIAGNOSIS — S42001D Fracture of unspecified part of right clavicle, subsequent encounter for fracture with routine healing: Secondary | ICD-10-CM | POA: Diagnosis not present

## 2017-04-27 DIAGNOSIS — R262 Difficulty in walking, not elsewhere classified: Secondary | ICD-10-CM | POA: Diagnosis not present

## 2017-04-27 DIAGNOSIS — R1312 Dysphagia, oropharyngeal phase: Secondary | ICD-10-CM | POA: Diagnosis not present

## 2017-04-28 DIAGNOSIS — S42001D Fracture of unspecified part of right clavicle, subsequent encounter for fracture with routine healing: Secondary | ICD-10-CM | POA: Diagnosis not present

## 2017-04-28 DIAGNOSIS — R262 Difficulty in walking, not elsewhere classified: Secondary | ICD-10-CM | POA: Diagnosis not present

## 2017-04-28 DIAGNOSIS — M25552 Pain in left hip: Secondary | ICD-10-CM | POA: Diagnosis not present

## 2017-04-28 DIAGNOSIS — R1312 Dysphagia, oropharyngeal phase: Secondary | ICD-10-CM | POA: Diagnosis not present

## 2017-04-28 DIAGNOSIS — M6281 Muscle weakness (generalized): Secondary | ICD-10-CM | POA: Diagnosis not present

## 2017-04-29 DIAGNOSIS — R1312 Dysphagia, oropharyngeal phase: Secondary | ICD-10-CM | POA: Diagnosis not present

## 2017-04-29 DIAGNOSIS — R262 Difficulty in walking, not elsewhere classified: Secondary | ICD-10-CM | POA: Diagnosis not present

## 2017-04-29 DIAGNOSIS — M25552 Pain in left hip: Secondary | ICD-10-CM | POA: Diagnosis not present

## 2017-04-29 DIAGNOSIS — S42001D Fracture of unspecified part of right clavicle, subsequent encounter for fracture with routine healing: Secondary | ICD-10-CM | POA: Diagnosis not present

## 2017-04-29 DIAGNOSIS — M6281 Muscle weakness (generalized): Secondary | ICD-10-CM | POA: Diagnosis not present

## 2017-04-30 DIAGNOSIS — S42001D Fracture of unspecified part of right clavicle, subsequent encounter for fracture with routine healing: Secondary | ICD-10-CM | POA: Diagnosis not present

## 2017-04-30 DIAGNOSIS — R1312 Dysphagia, oropharyngeal phase: Secondary | ICD-10-CM | POA: Diagnosis not present

## 2017-04-30 DIAGNOSIS — M6281 Muscle weakness (generalized): Secondary | ICD-10-CM | POA: Diagnosis not present

## 2017-04-30 DIAGNOSIS — R262 Difficulty in walking, not elsewhere classified: Secondary | ICD-10-CM | POA: Diagnosis not present

## 2017-04-30 DIAGNOSIS — M25552 Pain in left hip: Secondary | ICD-10-CM | POA: Diagnosis not present

## 2017-05-03 DIAGNOSIS — M6281 Muscle weakness (generalized): Secondary | ICD-10-CM | POA: Diagnosis not present

## 2017-05-03 DIAGNOSIS — S42001D Fracture of unspecified part of right clavicle, subsequent encounter for fracture with routine healing: Secondary | ICD-10-CM | POA: Diagnosis not present

## 2017-05-03 DIAGNOSIS — M25552 Pain in left hip: Secondary | ICD-10-CM | POA: Diagnosis not present

## 2017-05-03 DIAGNOSIS — R1312 Dysphagia, oropharyngeal phase: Secondary | ICD-10-CM | POA: Diagnosis not present

## 2017-05-03 DIAGNOSIS — R262 Difficulty in walking, not elsewhere classified: Secondary | ICD-10-CM | POA: Diagnosis not present

## 2017-05-05 DIAGNOSIS — I63541 Cerebral infarction due to unspecified occlusion or stenosis of right cerebellar artery: Secondary | ICD-10-CM | POA: Diagnosis not present

## 2017-05-05 DIAGNOSIS — G301 Alzheimer's disease with late onset: Secondary | ICD-10-CM | POA: Diagnosis not present

## 2017-05-05 DIAGNOSIS — M6281 Muscle weakness (generalized): Secondary | ICD-10-CM | POA: Diagnosis not present

## 2017-05-09 DIAGNOSIS — M6281 Muscle weakness (generalized): Secondary | ICD-10-CM | POA: Diagnosis not present

## 2017-05-09 DIAGNOSIS — F419 Anxiety disorder, unspecified: Secondary | ICD-10-CM | POA: Diagnosis not present

## 2017-05-09 DIAGNOSIS — F0281 Dementia in other diseases classified elsewhere with behavioral disturbance: Secondary | ICD-10-CM | POA: Diagnosis not present

## 2017-05-09 DIAGNOSIS — I10 Essential (primary) hypertension: Secondary | ICD-10-CM | POA: Diagnosis not present

## 2017-05-10 DIAGNOSIS — S01111A Laceration without foreign body of right eyelid and periocular area, initial encounter: Secondary | ICD-10-CM | POA: Diagnosis not present

## 2017-05-10 DIAGNOSIS — S0990XA Unspecified injury of head, initial encounter: Secondary | ICD-10-CM | POA: Diagnosis not present

## 2017-05-10 DIAGNOSIS — M6281 Muscle weakness (generalized): Secondary | ICD-10-CM | POA: Diagnosis not present

## 2017-05-10 DIAGNOSIS — S0191XA Laceration without foreign body of unspecified part of head, initial encounter: Secondary | ICD-10-CM | POA: Diagnosis not present

## 2017-05-10 DIAGNOSIS — I10 Essential (primary) hypertension: Secondary | ICD-10-CM | POA: Diagnosis not present

## 2017-05-10 DIAGNOSIS — G301 Alzheimer's disease with late onset: Secondary | ICD-10-CM | POA: Diagnosis not present

## 2017-05-10 DIAGNOSIS — Z79899 Other long term (current) drug therapy: Secondary | ICD-10-CM | POA: Diagnosis not present

## 2017-05-10 DIAGNOSIS — Z7982 Long term (current) use of aspirin: Secondary | ICD-10-CM | POA: Diagnosis not present

## 2017-05-10 DIAGNOSIS — Z8673 Personal history of transient ischemic attack (TIA), and cerebral infarction without residual deficits: Secondary | ICD-10-CM | POA: Diagnosis not present

## 2017-05-10 DIAGNOSIS — R269 Unspecified abnormalities of gait and mobility: Secondary | ICD-10-CM | POA: Diagnosis not present

## 2017-05-10 DIAGNOSIS — R51 Headache: Secondary | ICD-10-CM | POA: Diagnosis not present

## 2017-05-10 DIAGNOSIS — R4182 Altered mental status, unspecified: Secondary | ICD-10-CM | POA: Diagnosis not present

## 2017-05-10 DIAGNOSIS — F0390 Unspecified dementia without behavioral disturbance: Secondary | ICD-10-CM | POA: Diagnosis not present

## 2017-05-19 DIAGNOSIS — Z20828 Contact with and (suspected) exposure to other viral communicable diseases: Secondary | ICD-10-CM | POA: Diagnosis not present

## 2017-05-19 DIAGNOSIS — R319 Hematuria, unspecified: Secondary | ICD-10-CM | POA: Diagnosis not present

## 2017-05-19 DIAGNOSIS — N390 Urinary tract infection, site not specified: Secondary | ICD-10-CM | POA: Diagnosis not present

## 2017-05-19 DIAGNOSIS — R7989 Other specified abnormal findings of blood chemistry: Secondary | ICD-10-CM | POA: Diagnosis not present

## 2017-05-19 DIAGNOSIS — Z79899 Other long term (current) drug therapy: Secondary | ICD-10-CM | POA: Diagnosis not present

## 2017-06-02 DIAGNOSIS — I63541 Cerebral infarction due to unspecified occlusion or stenosis of right cerebellar artery: Secondary | ICD-10-CM | POA: Diagnosis not present

## 2017-06-02 DIAGNOSIS — G301 Alzheimer's disease with late onset: Secondary | ICD-10-CM | POA: Diagnosis not present

## 2017-06-02 DIAGNOSIS — M6281 Muscle weakness (generalized): Secondary | ICD-10-CM | POA: Diagnosis not present

## 2017-06-02 DIAGNOSIS — I10 Essential (primary) hypertension: Secondary | ICD-10-CM | POA: Diagnosis not present

## 2017-06-03 DIAGNOSIS — E785 Hyperlipidemia, unspecified: Secondary | ICD-10-CM | POA: Diagnosis not present

## 2017-06-03 DIAGNOSIS — E559 Vitamin D deficiency, unspecified: Secondary | ICD-10-CM | POA: Diagnosis not present

## 2017-06-03 DIAGNOSIS — E039 Hypothyroidism, unspecified: Secondary | ICD-10-CM | POA: Diagnosis not present

## 2017-06-03 DIAGNOSIS — R7989 Other specified abnormal findings of blood chemistry: Secondary | ICD-10-CM | POA: Diagnosis not present

## 2017-06-03 DIAGNOSIS — E119 Type 2 diabetes mellitus without complications: Secondary | ICD-10-CM | POA: Diagnosis not present

## 2017-06-04 DIAGNOSIS — F0281 Dementia in other diseases classified elsewhere with behavioral disturbance: Secondary | ICD-10-CM | POA: Diagnosis not present

## 2017-06-04 DIAGNOSIS — F411 Generalized anxiety disorder: Secondary | ICD-10-CM | POA: Diagnosis not present

## 2017-06-04 DIAGNOSIS — F321 Major depressive disorder, single episode, moderate: Secondary | ICD-10-CM | POA: Diagnosis not present

## 2017-06-10 DIAGNOSIS — I483 Typical atrial flutter: Secondary | ICD-10-CM | POA: Diagnosis not present

## 2017-06-10 DIAGNOSIS — D508 Other iron deficiency anemias: Secondary | ICD-10-CM | POA: Diagnosis not present

## 2017-06-10 DIAGNOSIS — I63541 Cerebral infarction due to unspecified occlusion or stenosis of right cerebellar artery: Secondary | ICD-10-CM | POA: Diagnosis not present

## 2017-06-10 DIAGNOSIS — I10 Essential (primary) hypertension: Secondary | ICD-10-CM | POA: Diagnosis not present

## 2017-06-21 DIAGNOSIS — M6281 Muscle weakness (generalized): Secondary | ICD-10-CM | POA: Diagnosis not present

## 2017-06-21 DIAGNOSIS — S42001D Fracture of unspecified part of right clavicle, subsequent encounter for fracture with routine healing: Secondary | ICD-10-CM | POA: Diagnosis not present

## 2017-06-21 DIAGNOSIS — R262 Difficulty in walking, not elsewhere classified: Secondary | ICD-10-CM | POA: Diagnosis not present

## 2017-06-22 DIAGNOSIS — S42001D Fracture of unspecified part of right clavicle, subsequent encounter for fracture with routine healing: Secondary | ICD-10-CM | POA: Diagnosis not present

## 2017-06-22 DIAGNOSIS — M6281 Muscle weakness (generalized): Secondary | ICD-10-CM | POA: Diagnosis not present

## 2017-06-22 DIAGNOSIS — R262 Difficulty in walking, not elsewhere classified: Secondary | ICD-10-CM | POA: Diagnosis not present

## 2017-06-23 DIAGNOSIS — M6281 Muscle weakness (generalized): Secondary | ICD-10-CM | POA: Diagnosis not present

## 2017-06-23 DIAGNOSIS — S42001D Fracture of unspecified part of right clavicle, subsequent encounter for fracture with routine healing: Secondary | ICD-10-CM | POA: Diagnosis not present

## 2017-06-23 DIAGNOSIS — R262 Difficulty in walking, not elsewhere classified: Secondary | ICD-10-CM | POA: Diagnosis not present

## 2017-06-24 DIAGNOSIS — S42001D Fracture of unspecified part of right clavicle, subsequent encounter for fracture with routine healing: Secondary | ICD-10-CM | POA: Diagnosis not present

## 2017-06-24 DIAGNOSIS — R262 Difficulty in walking, not elsewhere classified: Secondary | ICD-10-CM | POA: Diagnosis not present

## 2017-06-24 DIAGNOSIS — M6281 Muscle weakness (generalized): Secondary | ICD-10-CM | POA: Diagnosis not present

## 2017-06-25 DIAGNOSIS — S42001D Fracture of unspecified part of right clavicle, subsequent encounter for fracture with routine healing: Secondary | ICD-10-CM | POA: Diagnosis not present

## 2017-06-25 DIAGNOSIS — R262 Difficulty in walking, not elsewhere classified: Secondary | ICD-10-CM | POA: Diagnosis not present

## 2017-06-25 DIAGNOSIS — M6281 Muscle weakness (generalized): Secondary | ICD-10-CM | POA: Diagnosis not present

## 2017-06-28 DIAGNOSIS — R262 Difficulty in walking, not elsewhere classified: Secondary | ICD-10-CM | POA: Diagnosis not present

## 2017-06-28 DIAGNOSIS — S42001D Fracture of unspecified part of right clavicle, subsequent encounter for fracture with routine healing: Secondary | ICD-10-CM | POA: Diagnosis not present

## 2017-06-28 DIAGNOSIS — M6281 Muscle weakness (generalized): Secondary | ICD-10-CM | POA: Diagnosis not present

## 2017-06-29 DIAGNOSIS — G301 Alzheimer's disease with late onset: Secondary | ICD-10-CM | POA: Diagnosis not present

## 2017-06-29 DIAGNOSIS — I10 Essential (primary) hypertension: Secondary | ICD-10-CM | POA: Diagnosis not present

## 2017-06-29 DIAGNOSIS — M6281 Muscle weakness (generalized): Secondary | ICD-10-CM | POA: Diagnosis not present

## 2017-06-29 DIAGNOSIS — I63541 Cerebral infarction due to unspecified occlusion or stenosis of right cerebellar artery: Secondary | ICD-10-CM | POA: Diagnosis not present

## 2017-06-29 DIAGNOSIS — S42001D Fracture of unspecified part of right clavicle, subsequent encounter for fracture with routine healing: Secondary | ICD-10-CM | POA: Diagnosis not present

## 2017-06-29 DIAGNOSIS — R262 Difficulty in walking, not elsewhere classified: Secondary | ICD-10-CM | POA: Diagnosis not present

## 2017-06-30 DIAGNOSIS — S42001D Fracture of unspecified part of right clavicle, subsequent encounter for fracture with routine healing: Secondary | ICD-10-CM | POA: Diagnosis not present

## 2017-06-30 DIAGNOSIS — M6281 Muscle weakness (generalized): Secondary | ICD-10-CM | POA: Diagnosis not present

## 2017-06-30 DIAGNOSIS — R262 Difficulty in walking, not elsewhere classified: Secondary | ICD-10-CM | POA: Diagnosis not present

## 2017-07-01 DIAGNOSIS — R262 Difficulty in walking, not elsewhere classified: Secondary | ICD-10-CM | POA: Diagnosis not present

## 2017-07-01 DIAGNOSIS — S42001D Fracture of unspecified part of right clavicle, subsequent encounter for fracture with routine healing: Secondary | ICD-10-CM | POA: Diagnosis not present

## 2017-07-01 DIAGNOSIS — M6281 Muscle weakness (generalized): Secondary | ICD-10-CM | POA: Diagnosis not present

## 2017-07-02 DIAGNOSIS — R262 Difficulty in walking, not elsewhere classified: Secondary | ICD-10-CM | POA: Diagnosis not present

## 2017-07-02 DIAGNOSIS — M6281 Muscle weakness (generalized): Secondary | ICD-10-CM | POA: Diagnosis not present

## 2017-07-02 DIAGNOSIS — S42001D Fracture of unspecified part of right clavicle, subsequent encounter for fracture with routine healing: Secondary | ICD-10-CM | POA: Diagnosis not present

## 2017-07-05 DIAGNOSIS — S42001D Fracture of unspecified part of right clavicle, subsequent encounter for fracture with routine healing: Secondary | ICD-10-CM | POA: Diagnosis not present

## 2017-07-05 DIAGNOSIS — R262 Difficulty in walking, not elsewhere classified: Secondary | ICD-10-CM | POA: Diagnosis not present

## 2017-07-05 DIAGNOSIS — M6281 Muscle weakness (generalized): Secondary | ICD-10-CM | POA: Diagnosis not present

## 2017-07-16 DIAGNOSIS — Z79899 Other long term (current) drug therapy: Secondary | ICD-10-CM | POA: Diagnosis not present

## 2017-07-16 DIAGNOSIS — R4182 Altered mental status, unspecified: Secondary | ICD-10-CM | POA: Diagnosis not present

## 2017-07-16 DIAGNOSIS — N390 Urinary tract infection, site not specified: Secondary | ICD-10-CM | POA: Diagnosis not present

## 2017-07-16 DIAGNOSIS — D649 Anemia, unspecified: Secondary | ICD-10-CM | POA: Diagnosis not present

## 2017-07-16 DIAGNOSIS — R7989 Other specified abnormal findings of blood chemistry: Secondary | ICD-10-CM | POA: Diagnosis not present

## 2017-07-16 DIAGNOSIS — R0682 Tachypnea, not elsewhere classified: Secondary | ICD-10-CM | POA: Diagnosis not present

## 2017-07-16 DIAGNOSIS — R630 Anorexia: Secondary | ICD-10-CM | POA: Diagnosis not present

## 2017-07-16 DIAGNOSIS — R0989 Other specified symptoms and signs involving the circulatory and respiratory systems: Secondary | ICD-10-CM | POA: Diagnosis not present

## 2017-07-16 DIAGNOSIS — E039 Hypothyroidism, unspecified: Secondary | ICD-10-CM | POA: Diagnosis not present

## 2017-07-17 DIAGNOSIS — N390 Urinary tract infection, site not specified: Secondary | ICD-10-CM | POA: Diagnosis not present

## 2017-07-17 DIAGNOSIS — Z79899 Other long term (current) drug therapy: Secondary | ICD-10-CM | POA: Diagnosis not present

## 2017-07-17 DIAGNOSIS — R319 Hematuria, unspecified: Secondary | ICD-10-CM | POA: Diagnosis not present

## 2017-07-19 DIAGNOSIS — N390 Urinary tract infection, site not specified: Secondary | ICD-10-CM | POA: Diagnosis not present

## 2017-07-23 DIAGNOSIS — I10 Essential (primary) hypertension: Secondary | ICD-10-CM | POA: Diagnosis not present

## 2017-07-23 DIAGNOSIS — F419 Anxiety disorder, unspecified: Secondary | ICD-10-CM | POA: Diagnosis not present

## 2017-07-23 DIAGNOSIS — D508 Other iron deficiency anemias: Secondary | ICD-10-CM | POA: Diagnosis not present

## 2017-07-23 DIAGNOSIS — I63541 Cerebral infarction due to unspecified occlusion or stenosis of right cerebellar artery: Secondary | ICD-10-CM | POA: Diagnosis not present

## 2017-07-26 DIAGNOSIS — M6281 Muscle weakness (generalized): Secondary | ICD-10-CM | POA: Diagnosis not present

## 2017-07-26 DIAGNOSIS — F419 Anxiety disorder, unspecified: Secondary | ICD-10-CM | POA: Diagnosis not present

## 2017-07-26 DIAGNOSIS — G301 Alzheimer's disease with late onset: Secondary | ICD-10-CM | POA: Diagnosis not present

## 2017-07-26 DIAGNOSIS — I63541 Cerebral infarction due to unspecified occlusion or stenosis of right cerebellar artery: Secondary | ICD-10-CM | POA: Diagnosis not present

## 2017-08-09 DIAGNOSIS — M6281 Muscle weakness (generalized): Secondary | ICD-10-CM | POA: Diagnosis not present

## 2017-08-09 DIAGNOSIS — G301 Alzheimer's disease with late onset: Secondary | ICD-10-CM | POA: Diagnosis not present

## 2017-08-09 DIAGNOSIS — F419 Anxiety disorder, unspecified: Secondary | ICD-10-CM | POA: Diagnosis not present

## 2017-08-09 DIAGNOSIS — I63541 Cerebral infarction due to unspecified occlusion or stenosis of right cerebellar artery: Secondary | ICD-10-CM | POA: Diagnosis not present

## 2017-08-11 DIAGNOSIS — F0151 Vascular dementia with behavioral disturbance: Secondary | ICD-10-CM | POA: Diagnosis not present

## 2017-08-11 DIAGNOSIS — F321 Major depressive disorder, single episode, moderate: Secondary | ICD-10-CM | POA: Diagnosis not present

## 2017-08-11 DIAGNOSIS — D649 Anemia, unspecified: Secondary | ICD-10-CM | POA: Diagnosis not present

## 2017-08-11 DIAGNOSIS — I483 Typical atrial flutter: Secondary | ICD-10-CM | POA: Diagnosis not present

## 2017-08-11 DIAGNOSIS — D508 Other iron deficiency anemias: Secondary | ICD-10-CM | POA: Diagnosis not present

## 2017-08-11 DIAGNOSIS — F411 Generalized anxiety disorder: Secondary | ICD-10-CM | POA: Diagnosis not present

## 2017-08-11 DIAGNOSIS — F482 Pseudobulbar affect: Secondary | ICD-10-CM | POA: Diagnosis not present

## 2017-08-11 DIAGNOSIS — M6281 Muscle weakness (generalized): Secondary | ICD-10-CM | POA: Diagnosis not present

## 2017-08-11 DIAGNOSIS — I10 Essential (primary) hypertension: Secondary | ICD-10-CM | POA: Diagnosis not present

## 2017-08-11 DIAGNOSIS — K219 Gastro-esophageal reflux disease without esophagitis: Secondary | ICD-10-CM | POA: Diagnosis not present

## 2017-08-11 DIAGNOSIS — G301 Alzheimer's disease with late onset: Secondary | ICD-10-CM | POA: Diagnosis not present

## 2017-08-11 DIAGNOSIS — R1312 Dysphagia, oropharyngeal phase: Secondary | ICD-10-CM | POA: Diagnosis not present

## 2017-08-11 DIAGNOSIS — I63541 Cerebral infarction due to unspecified occlusion or stenosis of right cerebellar artery: Secondary | ICD-10-CM | POA: Diagnosis not present

## 2017-08-11 DIAGNOSIS — F0281 Dementia in other diseases classified elsewhere with behavioral disturbance: Secondary | ICD-10-CM | POA: Diagnosis not present

## 2017-08-12 DIAGNOSIS — G301 Alzheimer's disease with late onset: Secondary | ICD-10-CM | POA: Diagnosis not present

## 2017-08-12 DIAGNOSIS — F0281 Dementia in other diseases classified elsewhere with behavioral disturbance: Secondary | ICD-10-CM | POA: Diagnosis not present

## 2017-08-12 DIAGNOSIS — R1312 Dysphagia, oropharyngeal phase: Secondary | ICD-10-CM | POA: Diagnosis not present

## 2017-08-12 DIAGNOSIS — M6281 Muscle weakness (generalized): Secondary | ICD-10-CM | POA: Diagnosis not present

## 2017-08-12 DIAGNOSIS — D649 Anemia, unspecified: Secondary | ICD-10-CM | POA: Diagnosis not present

## 2017-08-12 DIAGNOSIS — I63541 Cerebral infarction due to unspecified occlusion or stenosis of right cerebellar artery: Secondary | ICD-10-CM | POA: Diagnosis not present

## 2017-08-13 DIAGNOSIS — R1312 Dysphagia, oropharyngeal phase: Secondary | ICD-10-CM | POA: Diagnosis not present

## 2017-08-13 DIAGNOSIS — M6281 Muscle weakness (generalized): Secondary | ICD-10-CM | POA: Diagnosis not present

## 2017-08-13 DIAGNOSIS — G301 Alzheimer's disease with late onset: Secondary | ICD-10-CM | POA: Diagnosis not present

## 2017-08-13 DIAGNOSIS — F0281 Dementia in other diseases classified elsewhere with behavioral disturbance: Secondary | ICD-10-CM | POA: Diagnosis not present

## 2017-08-13 DIAGNOSIS — D649 Anemia, unspecified: Secondary | ICD-10-CM | POA: Diagnosis not present

## 2017-08-13 DIAGNOSIS — I63541 Cerebral infarction due to unspecified occlusion or stenosis of right cerebellar artery: Secondary | ICD-10-CM | POA: Diagnosis not present

## 2017-08-17 DIAGNOSIS — I63541 Cerebral infarction due to unspecified occlusion or stenosis of right cerebellar artery: Secondary | ICD-10-CM | POA: Diagnosis not present

## 2017-08-17 DIAGNOSIS — M6281 Muscle weakness (generalized): Secondary | ICD-10-CM | POA: Diagnosis not present

## 2017-08-17 DIAGNOSIS — F0281 Dementia in other diseases classified elsewhere with behavioral disturbance: Secondary | ICD-10-CM | POA: Diagnosis not present

## 2017-08-17 DIAGNOSIS — G301 Alzheimer's disease with late onset: Secondary | ICD-10-CM | POA: Diagnosis not present

## 2017-08-17 DIAGNOSIS — R1312 Dysphagia, oropharyngeal phase: Secondary | ICD-10-CM | POA: Diagnosis not present

## 2017-08-17 DIAGNOSIS — D649 Anemia, unspecified: Secondary | ICD-10-CM | POA: Diagnosis not present

## 2017-08-18 DIAGNOSIS — F0281 Dementia in other diseases classified elsewhere with behavioral disturbance: Secondary | ICD-10-CM | POA: Diagnosis not present

## 2017-08-18 DIAGNOSIS — D649 Anemia, unspecified: Secondary | ICD-10-CM | POA: Diagnosis not present

## 2017-08-18 DIAGNOSIS — G301 Alzheimer's disease with late onset: Secondary | ICD-10-CM | POA: Diagnosis not present

## 2017-08-18 DIAGNOSIS — R1312 Dysphagia, oropharyngeal phase: Secondary | ICD-10-CM | POA: Diagnosis not present

## 2017-08-18 DIAGNOSIS — I10 Essential (primary) hypertension: Secondary | ICD-10-CM | POA: Diagnosis not present

## 2017-08-18 DIAGNOSIS — M6281 Muscle weakness (generalized): Secondary | ICD-10-CM | POA: Diagnosis not present

## 2017-08-18 DIAGNOSIS — I63541 Cerebral infarction due to unspecified occlusion or stenosis of right cerebellar artery: Secondary | ICD-10-CM | POA: Diagnosis not present

## 2017-08-20 DIAGNOSIS — R1312 Dysphagia, oropharyngeal phase: Secondary | ICD-10-CM | POA: Diagnosis not present

## 2017-08-20 DIAGNOSIS — M6281 Muscle weakness (generalized): Secondary | ICD-10-CM | POA: Diagnosis not present

## 2017-08-20 DIAGNOSIS — F0281 Dementia in other diseases classified elsewhere with behavioral disturbance: Secondary | ICD-10-CM | POA: Diagnosis not present

## 2017-08-20 DIAGNOSIS — G301 Alzheimer's disease with late onset: Secondary | ICD-10-CM | POA: Diagnosis not present

## 2017-08-20 DIAGNOSIS — I63541 Cerebral infarction due to unspecified occlusion or stenosis of right cerebellar artery: Secondary | ICD-10-CM | POA: Diagnosis not present

## 2017-08-20 DIAGNOSIS — D649 Anemia, unspecified: Secondary | ICD-10-CM | POA: Diagnosis not present

## 2017-08-23 DIAGNOSIS — R1312 Dysphagia, oropharyngeal phase: Secondary | ICD-10-CM | POA: Diagnosis not present

## 2017-08-23 DIAGNOSIS — G301 Alzheimer's disease with late onset: Secondary | ICD-10-CM | POA: Diagnosis not present

## 2017-08-23 DIAGNOSIS — I63541 Cerebral infarction due to unspecified occlusion or stenosis of right cerebellar artery: Secondary | ICD-10-CM | POA: Diagnosis not present

## 2017-08-23 DIAGNOSIS — D649 Anemia, unspecified: Secondary | ICD-10-CM | POA: Diagnosis not present

## 2017-08-23 DIAGNOSIS — F0281 Dementia in other diseases classified elsewhere with behavioral disturbance: Secondary | ICD-10-CM | POA: Diagnosis not present

## 2017-08-23 DIAGNOSIS — M6281 Muscle weakness (generalized): Secondary | ICD-10-CM | POA: Diagnosis not present

## 2017-08-25 DIAGNOSIS — F0281 Dementia in other diseases classified elsewhere with behavioral disturbance: Secondary | ICD-10-CM | POA: Diagnosis not present

## 2017-08-25 DIAGNOSIS — D649 Anemia, unspecified: Secondary | ICD-10-CM | POA: Diagnosis not present

## 2017-08-25 DIAGNOSIS — R1310 Dysphagia, unspecified: Secondary | ICD-10-CM | POA: Diagnosis not present

## 2017-08-25 DIAGNOSIS — I63541 Cerebral infarction due to unspecified occlusion or stenosis of right cerebellar artery: Secondary | ICD-10-CM | POA: Diagnosis not present

## 2017-08-25 DIAGNOSIS — I10 Essential (primary) hypertension: Secondary | ICD-10-CM | POA: Diagnosis not present

## 2017-08-25 DIAGNOSIS — M6281 Muscle weakness (generalized): Secondary | ICD-10-CM | POA: Diagnosis not present

## 2017-08-25 DIAGNOSIS — R1312 Dysphagia, oropharyngeal phase: Secondary | ICD-10-CM | POA: Diagnosis not present

## 2017-08-25 DIAGNOSIS — G301 Alzheimer's disease with late onset: Secondary | ICD-10-CM | POA: Diagnosis not present

## 2017-08-27 DIAGNOSIS — M6281 Muscle weakness (generalized): Secondary | ICD-10-CM | POA: Diagnosis not present

## 2017-08-27 DIAGNOSIS — F0281 Dementia in other diseases classified elsewhere with behavioral disturbance: Secondary | ICD-10-CM | POA: Diagnosis not present

## 2017-08-27 DIAGNOSIS — R1312 Dysphagia, oropharyngeal phase: Secondary | ICD-10-CM | POA: Diagnosis not present

## 2017-08-27 DIAGNOSIS — I63541 Cerebral infarction due to unspecified occlusion or stenosis of right cerebellar artery: Secondary | ICD-10-CM | POA: Diagnosis not present

## 2017-08-27 DIAGNOSIS — D649 Anemia, unspecified: Secondary | ICD-10-CM | POA: Diagnosis not present

## 2017-08-27 DIAGNOSIS — G301 Alzheimer's disease with late onset: Secondary | ICD-10-CM | POA: Diagnosis not present

## 2017-08-30 DIAGNOSIS — D649 Anemia, unspecified: Secondary | ICD-10-CM | POA: Diagnosis not present

## 2017-08-30 DIAGNOSIS — F0281 Dementia in other diseases classified elsewhere with behavioral disturbance: Secondary | ICD-10-CM | POA: Diagnosis not present

## 2017-08-30 DIAGNOSIS — G301 Alzheimer's disease with late onset: Secondary | ICD-10-CM | POA: Diagnosis not present

## 2017-08-30 DIAGNOSIS — M6281 Muscle weakness (generalized): Secondary | ICD-10-CM | POA: Diagnosis not present

## 2017-08-30 DIAGNOSIS — I63541 Cerebral infarction due to unspecified occlusion or stenosis of right cerebellar artery: Secondary | ICD-10-CM | POA: Diagnosis not present

## 2017-08-30 DIAGNOSIS — R1312 Dysphagia, oropharyngeal phase: Secondary | ICD-10-CM | POA: Diagnosis not present

## 2017-09-01 DIAGNOSIS — G301 Alzheimer's disease with late onset: Secondary | ICD-10-CM | POA: Diagnosis not present

## 2017-09-01 DIAGNOSIS — I63541 Cerebral infarction due to unspecified occlusion or stenosis of right cerebellar artery: Secondary | ICD-10-CM | POA: Diagnosis not present

## 2017-09-01 DIAGNOSIS — R1312 Dysphagia, oropharyngeal phase: Secondary | ICD-10-CM | POA: Diagnosis not present

## 2017-09-01 DIAGNOSIS — M6281 Muscle weakness (generalized): Secondary | ICD-10-CM | POA: Diagnosis not present

## 2017-09-01 DIAGNOSIS — F0281 Dementia in other diseases classified elsewhere with behavioral disturbance: Secondary | ICD-10-CM | POA: Diagnosis not present

## 2017-09-01 DIAGNOSIS — D649 Anemia, unspecified: Secondary | ICD-10-CM | POA: Diagnosis not present

## 2017-09-03 DIAGNOSIS — F0281 Dementia in other diseases classified elsewhere with behavioral disturbance: Secondary | ICD-10-CM | POA: Diagnosis not present

## 2017-09-03 DIAGNOSIS — G301 Alzheimer's disease with late onset: Secondary | ICD-10-CM | POA: Diagnosis not present

## 2017-09-03 DIAGNOSIS — R1312 Dysphagia, oropharyngeal phase: Secondary | ICD-10-CM | POA: Diagnosis not present

## 2017-09-03 DIAGNOSIS — F411 Generalized anxiety disorder: Secondary | ICD-10-CM | POA: Diagnosis not present

## 2017-09-03 DIAGNOSIS — F321 Major depressive disorder, single episode, moderate: Secondary | ICD-10-CM | POA: Diagnosis not present

## 2017-09-03 DIAGNOSIS — I63541 Cerebral infarction due to unspecified occlusion or stenosis of right cerebellar artery: Secondary | ICD-10-CM | POA: Diagnosis not present

## 2017-09-03 DIAGNOSIS — D649 Anemia, unspecified: Secondary | ICD-10-CM | POA: Diagnosis not present

## 2017-09-03 DIAGNOSIS — M6281 Muscle weakness (generalized): Secondary | ICD-10-CM | POA: Diagnosis not present

## 2017-09-06 DIAGNOSIS — G301 Alzheimer's disease with late onset: Secondary | ICD-10-CM | POA: Diagnosis not present

## 2017-09-06 DIAGNOSIS — R1312 Dysphagia, oropharyngeal phase: Secondary | ICD-10-CM | POA: Diagnosis not present

## 2017-09-06 DIAGNOSIS — M6281 Muscle weakness (generalized): Secondary | ICD-10-CM | POA: Diagnosis not present

## 2017-09-06 DIAGNOSIS — F482 Pseudobulbar affect: Secondary | ICD-10-CM | POA: Diagnosis not present

## 2017-09-06 DIAGNOSIS — F411 Generalized anxiety disorder: Secondary | ICD-10-CM | POA: Diagnosis not present

## 2017-09-06 DIAGNOSIS — D649 Anemia, unspecified: Secondary | ICD-10-CM | POA: Diagnosis not present

## 2017-09-06 DIAGNOSIS — I63541 Cerebral infarction due to unspecified occlusion or stenosis of right cerebellar artery: Secondary | ICD-10-CM | POA: Diagnosis not present

## 2017-09-06 DIAGNOSIS — I10 Essential (primary) hypertension: Secondary | ICD-10-CM | POA: Diagnosis not present

## 2017-09-06 DIAGNOSIS — I483 Typical atrial flutter: Secondary | ICD-10-CM | POA: Diagnosis not present

## 2017-09-06 DIAGNOSIS — K219 Gastro-esophageal reflux disease without esophagitis: Secondary | ICD-10-CM | POA: Diagnosis not present

## 2017-09-06 DIAGNOSIS — F321 Major depressive disorder, single episode, moderate: Secondary | ICD-10-CM | POA: Diagnosis not present

## 2017-09-06 DIAGNOSIS — F0151 Vascular dementia with behavioral disturbance: Secondary | ICD-10-CM | POA: Diagnosis not present

## 2017-09-06 DIAGNOSIS — D508 Other iron deficiency anemias: Secondary | ICD-10-CM | POA: Diagnosis not present

## 2017-09-06 DIAGNOSIS — F0281 Dementia in other diseases classified elsewhere with behavioral disturbance: Secondary | ICD-10-CM | POA: Diagnosis not present

## 2017-09-08 DIAGNOSIS — M6281 Muscle weakness (generalized): Secondary | ICD-10-CM | POA: Diagnosis not present

## 2017-09-08 DIAGNOSIS — R1312 Dysphagia, oropharyngeal phase: Secondary | ICD-10-CM | POA: Diagnosis not present

## 2017-09-08 DIAGNOSIS — D649 Anemia, unspecified: Secondary | ICD-10-CM | POA: Diagnosis not present

## 2017-09-08 DIAGNOSIS — I63541 Cerebral infarction due to unspecified occlusion or stenosis of right cerebellar artery: Secondary | ICD-10-CM | POA: Diagnosis not present

## 2017-09-08 DIAGNOSIS — F0281 Dementia in other diseases classified elsewhere with behavioral disturbance: Secondary | ICD-10-CM | POA: Diagnosis not present

## 2017-09-08 DIAGNOSIS — G301 Alzheimer's disease with late onset: Secondary | ICD-10-CM | POA: Diagnosis not present

## 2017-09-10 DIAGNOSIS — D649 Anemia, unspecified: Secondary | ICD-10-CM | POA: Diagnosis not present

## 2017-09-10 DIAGNOSIS — R1312 Dysphagia, oropharyngeal phase: Secondary | ICD-10-CM | POA: Diagnosis not present

## 2017-09-10 DIAGNOSIS — I63541 Cerebral infarction due to unspecified occlusion or stenosis of right cerebellar artery: Secondary | ICD-10-CM | POA: Diagnosis not present

## 2017-09-10 DIAGNOSIS — G301 Alzheimer's disease with late onset: Secondary | ICD-10-CM | POA: Diagnosis not present

## 2017-09-10 DIAGNOSIS — F0281 Dementia in other diseases classified elsewhere with behavioral disturbance: Secondary | ICD-10-CM | POA: Diagnosis not present

## 2017-09-10 DIAGNOSIS — M6281 Muscle weakness (generalized): Secondary | ICD-10-CM | POA: Diagnosis not present

## 2017-09-13 DIAGNOSIS — M6281 Muscle weakness (generalized): Secondary | ICD-10-CM | POA: Diagnosis not present

## 2017-09-13 DIAGNOSIS — D649 Anemia, unspecified: Secondary | ICD-10-CM | POA: Diagnosis not present

## 2017-09-13 DIAGNOSIS — I63541 Cerebral infarction due to unspecified occlusion or stenosis of right cerebellar artery: Secondary | ICD-10-CM | POA: Diagnosis not present

## 2017-09-13 DIAGNOSIS — F0281 Dementia in other diseases classified elsewhere with behavioral disturbance: Secondary | ICD-10-CM | POA: Diagnosis not present

## 2017-09-13 DIAGNOSIS — R1312 Dysphagia, oropharyngeal phase: Secondary | ICD-10-CM | POA: Diagnosis not present

## 2017-09-13 DIAGNOSIS — G301 Alzheimer's disease with late onset: Secondary | ICD-10-CM | POA: Diagnosis not present

## 2017-09-15 DIAGNOSIS — D649 Anemia, unspecified: Secondary | ICD-10-CM | POA: Diagnosis not present

## 2017-09-15 DIAGNOSIS — I10 Essential (primary) hypertension: Secondary | ICD-10-CM | POA: Diagnosis not present

## 2017-09-15 DIAGNOSIS — I63541 Cerebral infarction due to unspecified occlusion or stenosis of right cerebellar artery: Secondary | ICD-10-CM | POA: Diagnosis not present

## 2017-09-15 DIAGNOSIS — M6281 Muscle weakness (generalized): Secondary | ICD-10-CM | POA: Diagnosis not present

## 2017-09-15 DIAGNOSIS — F0281 Dementia in other diseases classified elsewhere with behavioral disturbance: Secondary | ICD-10-CM | POA: Diagnosis not present

## 2017-09-15 DIAGNOSIS — R1312 Dysphagia, oropharyngeal phase: Secondary | ICD-10-CM | POA: Diagnosis not present

## 2017-09-15 DIAGNOSIS — G301 Alzheimer's disease with late onset: Secondary | ICD-10-CM | POA: Diagnosis not present

## 2017-09-17 DIAGNOSIS — R1312 Dysphagia, oropharyngeal phase: Secondary | ICD-10-CM | POA: Diagnosis not present

## 2017-09-17 DIAGNOSIS — I63541 Cerebral infarction due to unspecified occlusion or stenosis of right cerebellar artery: Secondary | ICD-10-CM | POA: Diagnosis not present

## 2017-09-17 DIAGNOSIS — G301 Alzheimer's disease with late onset: Secondary | ICD-10-CM | POA: Diagnosis not present

## 2017-09-17 DIAGNOSIS — M6281 Muscle weakness (generalized): Secondary | ICD-10-CM | POA: Diagnosis not present

## 2017-09-17 DIAGNOSIS — D649 Anemia, unspecified: Secondary | ICD-10-CM | POA: Diagnosis not present

## 2017-09-17 DIAGNOSIS — F0281 Dementia in other diseases classified elsewhere with behavioral disturbance: Secondary | ICD-10-CM | POA: Diagnosis not present

## 2017-09-20 DIAGNOSIS — D649 Anemia, unspecified: Secondary | ICD-10-CM | POA: Diagnosis not present

## 2017-09-20 DIAGNOSIS — G301 Alzheimer's disease with late onset: Secondary | ICD-10-CM | POA: Diagnosis not present

## 2017-09-20 DIAGNOSIS — M6281 Muscle weakness (generalized): Secondary | ICD-10-CM | POA: Diagnosis not present

## 2017-09-20 DIAGNOSIS — I63541 Cerebral infarction due to unspecified occlusion or stenosis of right cerebellar artery: Secondary | ICD-10-CM | POA: Diagnosis not present

## 2017-09-20 DIAGNOSIS — R1312 Dysphagia, oropharyngeal phase: Secondary | ICD-10-CM | POA: Diagnosis not present

## 2017-09-20 DIAGNOSIS — F0281 Dementia in other diseases classified elsewhere with behavioral disturbance: Secondary | ICD-10-CM | POA: Diagnosis not present

## 2017-09-22 DIAGNOSIS — R1312 Dysphagia, oropharyngeal phase: Secondary | ICD-10-CM | POA: Diagnosis not present

## 2017-09-22 DIAGNOSIS — G301 Alzheimer's disease with late onset: Secondary | ICD-10-CM | POA: Diagnosis not present

## 2017-09-22 DIAGNOSIS — I63541 Cerebral infarction due to unspecified occlusion or stenosis of right cerebellar artery: Secondary | ICD-10-CM | POA: Diagnosis not present

## 2017-09-22 DIAGNOSIS — F0281 Dementia in other diseases classified elsewhere with behavioral disturbance: Secondary | ICD-10-CM | POA: Diagnosis not present

## 2017-09-22 DIAGNOSIS — D649 Anemia, unspecified: Secondary | ICD-10-CM | POA: Diagnosis not present

## 2017-09-22 DIAGNOSIS — M6281 Muscle weakness (generalized): Secondary | ICD-10-CM | POA: Diagnosis not present

## 2017-09-24 DIAGNOSIS — I63541 Cerebral infarction due to unspecified occlusion or stenosis of right cerebellar artery: Secondary | ICD-10-CM | POA: Diagnosis not present

## 2017-09-24 DIAGNOSIS — G301 Alzheimer's disease with late onset: Secondary | ICD-10-CM | POA: Diagnosis not present

## 2017-09-24 DIAGNOSIS — D649 Anemia, unspecified: Secondary | ICD-10-CM | POA: Diagnosis not present

## 2017-09-24 DIAGNOSIS — R1312 Dysphagia, oropharyngeal phase: Secondary | ICD-10-CM | POA: Diagnosis not present

## 2017-09-24 DIAGNOSIS — F0281 Dementia in other diseases classified elsewhere with behavioral disturbance: Secondary | ICD-10-CM | POA: Diagnosis not present

## 2017-09-24 DIAGNOSIS — M6281 Muscle weakness (generalized): Secondary | ICD-10-CM | POA: Diagnosis not present

## 2017-09-26 DIAGNOSIS — I10 Essential (primary) hypertension: Secondary | ICD-10-CM | POA: Diagnosis not present

## 2017-09-26 DIAGNOSIS — I63541 Cerebral infarction due to unspecified occlusion or stenosis of right cerebellar artery: Secondary | ICD-10-CM | POA: Diagnosis not present

## 2017-09-26 DIAGNOSIS — R1310 Dysphagia, unspecified: Secondary | ICD-10-CM | POA: Diagnosis not present

## 2017-09-26 DIAGNOSIS — G301 Alzheimer's disease with late onset: Secondary | ICD-10-CM | POA: Diagnosis not present

## 2017-09-27 DIAGNOSIS — R1312 Dysphagia, oropharyngeal phase: Secondary | ICD-10-CM | POA: Diagnosis not present

## 2017-09-27 DIAGNOSIS — I63541 Cerebral infarction due to unspecified occlusion or stenosis of right cerebellar artery: Secondary | ICD-10-CM | POA: Diagnosis not present

## 2017-09-27 DIAGNOSIS — D649 Anemia, unspecified: Secondary | ICD-10-CM | POA: Diagnosis not present

## 2017-09-27 DIAGNOSIS — F0281 Dementia in other diseases classified elsewhere with behavioral disturbance: Secondary | ICD-10-CM | POA: Diagnosis not present

## 2017-09-27 DIAGNOSIS — G301 Alzheimer's disease with late onset: Secondary | ICD-10-CM | POA: Diagnosis not present

## 2017-09-27 DIAGNOSIS — M6281 Muscle weakness (generalized): Secondary | ICD-10-CM | POA: Diagnosis not present

## 2017-09-28 DIAGNOSIS — G301 Alzheimer's disease with late onset: Secondary | ICD-10-CM | POA: Diagnosis not present

## 2017-09-28 DIAGNOSIS — F22 Delusional disorders: Secondary | ICD-10-CM | POA: Diagnosis not present

## 2017-09-28 DIAGNOSIS — D649 Anemia, unspecified: Secondary | ICD-10-CM | POA: Diagnosis not present

## 2017-09-28 DIAGNOSIS — S0083XD Contusion of other part of head, subsequent encounter: Secondary | ICD-10-CM | POA: Diagnosis not present

## 2017-09-28 DIAGNOSIS — I63541 Cerebral infarction due to unspecified occlusion or stenosis of right cerebellar artery: Secondary | ICD-10-CM | POA: Diagnosis not present

## 2017-09-28 DIAGNOSIS — R296 Repeated falls: Secondary | ICD-10-CM | POA: Diagnosis not present

## 2017-09-28 DIAGNOSIS — R1312 Dysphagia, oropharyngeal phase: Secondary | ICD-10-CM | POA: Diagnosis not present

## 2017-09-28 DIAGNOSIS — M6281 Muscle weakness (generalized): Secondary | ICD-10-CM | POA: Diagnosis not present

## 2017-09-28 DIAGNOSIS — F0281 Dementia in other diseases classified elsewhere with behavioral disturbance: Secondary | ICD-10-CM | POA: Diagnosis not present

## 2017-09-29 DIAGNOSIS — F0281 Dementia in other diseases classified elsewhere with behavioral disturbance: Secondary | ICD-10-CM | POA: Diagnosis not present

## 2017-09-29 DIAGNOSIS — R1312 Dysphagia, oropharyngeal phase: Secondary | ICD-10-CM | POA: Diagnosis not present

## 2017-09-29 DIAGNOSIS — M6281 Muscle weakness (generalized): Secondary | ICD-10-CM | POA: Diagnosis not present

## 2017-09-29 DIAGNOSIS — G301 Alzheimer's disease with late onset: Secondary | ICD-10-CM | POA: Diagnosis not present

## 2017-09-29 DIAGNOSIS — D649 Anemia, unspecified: Secondary | ICD-10-CM | POA: Diagnosis not present

## 2017-09-29 DIAGNOSIS — I63541 Cerebral infarction due to unspecified occlusion or stenosis of right cerebellar artery: Secondary | ICD-10-CM | POA: Diagnosis not present

## 2017-10-01 DIAGNOSIS — I63541 Cerebral infarction due to unspecified occlusion or stenosis of right cerebellar artery: Secondary | ICD-10-CM | POA: Diagnosis not present

## 2017-10-01 DIAGNOSIS — M6281 Muscle weakness (generalized): Secondary | ICD-10-CM | POA: Diagnosis not present

## 2017-10-01 DIAGNOSIS — D649 Anemia, unspecified: Secondary | ICD-10-CM | POA: Diagnosis not present

## 2017-10-01 DIAGNOSIS — G301 Alzheimer's disease with late onset: Secondary | ICD-10-CM | POA: Diagnosis not present

## 2017-10-01 DIAGNOSIS — R1312 Dysphagia, oropharyngeal phase: Secondary | ICD-10-CM | POA: Diagnosis not present

## 2017-10-01 DIAGNOSIS — F0281 Dementia in other diseases classified elsewhere with behavioral disturbance: Secondary | ICD-10-CM | POA: Diagnosis not present

## 2017-10-04 DIAGNOSIS — F0281 Dementia in other diseases classified elsewhere with behavioral disturbance: Secondary | ICD-10-CM | POA: Diagnosis not present

## 2017-10-04 DIAGNOSIS — D649 Anemia, unspecified: Secondary | ICD-10-CM | POA: Diagnosis not present

## 2017-10-04 DIAGNOSIS — G301 Alzheimer's disease with late onset: Secondary | ICD-10-CM | POA: Diagnosis not present

## 2017-10-04 DIAGNOSIS — M6281 Muscle weakness (generalized): Secondary | ICD-10-CM | POA: Diagnosis not present

## 2017-10-04 DIAGNOSIS — R1312 Dysphagia, oropharyngeal phase: Secondary | ICD-10-CM | POA: Diagnosis not present

## 2017-10-04 DIAGNOSIS — I63541 Cerebral infarction due to unspecified occlusion or stenosis of right cerebellar artery: Secondary | ICD-10-CM | POA: Diagnosis not present

## 2017-10-05 DIAGNOSIS — F0281 Dementia in other diseases classified elsewhere with behavioral disturbance: Secondary | ICD-10-CM | POA: Diagnosis not present

## 2017-10-05 DIAGNOSIS — G301 Alzheimer's disease with late onset: Secondary | ICD-10-CM | POA: Diagnosis not present

## 2017-10-05 DIAGNOSIS — I63541 Cerebral infarction due to unspecified occlusion or stenosis of right cerebellar artery: Secondary | ICD-10-CM | POA: Diagnosis not present

## 2017-10-05 DIAGNOSIS — D649 Anemia, unspecified: Secondary | ICD-10-CM | POA: Diagnosis not present

## 2017-10-05 DIAGNOSIS — R1312 Dysphagia, oropharyngeal phase: Secondary | ICD-10-CM | POA: Diagnosis not present

## 2017-10-05 DIAGNOSIS — M6281 Muscle weakness (generalized): Secondary | ICD-10-CM | POA: Diagnosis not present

## 2017-10-07 DIAGNOSIS — F482 Pseudobulbar affect: Secondary | ICD-10-CM | POA: Diagnosis not present

## 2017-10-07 DIAGNOSIS — I483 Typical atrial flutter: Secondary | ICD-10-CM | POA: Diagnosis not present

## 2017-10-07 DIAGNOSIS — F0281 Dementia in other diseases classified elsewhere with behavioral disturbance: Secondary | ICD-10-CM | POA: Diagnosis not present

## 2017-10-07 DIAGNOSIS — I63541 Cerebral infarction due to unspecified occlusion or stenosis of right cerebellar artery: Secondary | ICD-10-CM | POA: Diagnosis not present

## 2017-10-07 DIAGNOSIS — R1312 Dysphagia, oropharyngeal phase: Secondary | ICD-10-CM | POA: Diagnosis not present

## 2017-10-07 DIAGNOSIS — G301 Alzheimer's disease with late onset: Secondary | ICD-10-CM | POA: Diagnosis not present

## 2017-10-07 DIAGNOSIS — M6281 Muscle weakness (generalized): Secondary | ICD-10-CM | POA: Diagnosis not present

## 2017-10-07 DIAGNOSIS — K219 Gastro-esophageal reflux disease without esophagitis: Secondary | ICD-10-CM | POA: Diagnosis not present

## 2017-10-07 DIAGNOSIS — I10 Essential (primary) hypertension: Secondary | ICD-10-CM | POA: Diagnosis not present

## 2017-10-07 DIAGNOSIS — F411 Generalized anxiety disorder: Secondary | ICD-10-CM | POA: Diagnosis not present

## 2017-10-07 DIAGNOSIS — D508 Other iron deficiency anemias: Secondary | ICD-10-CM | POA: Diagnosis not present

## 2017-10-07 DIAGNOSIS — F321 Major depressive disorder, single episode, moderate: Secondary | ICD-10-CM | POA: Diagnosis not present

## 2017-10-07 DIAGNOSIS — F0151 Vascular dementia with behavioral disturbance: Secondary | ICD-10-CM | POA: Diagnosis not present

## 2017-10-07 DIAGNOSIS — D649 Anemia, unspecified: Secondary | ICD-10-CM | POA: Diagnosis not present

## 2017-10-08 DIAGNOSIS — R269 Unspecified abnormalities of gait and mobility: Secondary | ICD-10-CM | POA: Diagnosis not present

## 2017-10-08 DIAGNOSIS — D649 Anemia, unspecified: Secondary | ICD-10-CM | POA: Diagnosis not present

## 2017-10-08 DIAGNOSIS — R630 Anorexia: Secondary | ICD-10-CM | POA: Diagnosis not present

## 2017-10-08 DIAGNOSIS — G301 Alzheimer's disease with late onset: Secondary | ICD-10-CM | POA: Diagnosis not present

## 2017-10-08 DIAGNOSIS — F0281 Dementia in other diseases classified elsewhere with behavioral disturbance: Secondary | ICD-10-CM | POA: Diagnosis not present

## 2017-10-08 DIAGNOSIS — I63541 Cerebral infarction due to unspecified occlusion or stenosis of right cerebellar artery: Secondary | ICD-10-CM | POA: Diagnosis not present

## 2017-10-08 DIAGNOSIS — M6281 Muscle weakness (generalized): Secondary | ICD-10-CM | POA: Diagnosis not present

## 2017-10-08 DIAGNOSIS — R1312 Dysphagia, oropharyngeal phase: Secondary | ICD-10-CM | POA: Diagnosis not present

## 2017-10-10 DIAGNOSIS — F0281 Dementia in other diseases classified elsewhere with behavioral disturbance: Secondary | ICD-10-CM | POA: Diagnosis not present

## 2017-10-10 DIAGNOSIS — D649 Anemia, unspecified: Secondary | ICD-10-CM | POA: Diagnosis not present

## 2017-10-10 DIAGNOSIS — M6281 Muscle weakness (generalized): Secondary | ICD-10-CM | POA: Diagnosis not present

## 2017-10-10 DIAGNOSIS — I63541 Cerebral infarction due to unspecified occlusion or stenosis of right cerebellar artery: Secondary | ICD-10-CM | POA: Diagnosis not present

## 2017-10-10 DIAGNOSIS — G301 Alzheimer's disease with late onset: Secondary | ICD-10-CM | POA: Diagnosis not present

## 2017-10-10 DIAGNOSIS — R1312 Dysphagia, oropharyngeal phase: Secondary | ICD-10-CM | POA: Diagnosis not present

## 2017-10-11 DIAGNOSIS — D649 Anemia, unspecified: Secondary | ICD-10-CM | POA: Diagnosis not present

## 2017-10-11 DIAGNOSIS — F0281 Dementia in other diseases classified elsewhere with behavioral disturbance: Secondary | ICD-10-CM | POA: Diagnosis not present

## 2017-10-11 DIAGNOSIS — I63541 Cerebral infarction due to unspecified occlusion or stenosis of right cerebellar artery: Secondary | ICD-10-CM | POA: Diagnosis not present

## 2017-10-11 DIAGNOSIS — R1312 Dysphagia, oropharyngeal phase: Secondary | ICD-10-CM | POA: Diagnosis not present

## 2017-10-11 DIAGNOSIS — G301 Alzheimer's disease with late onset: Secondary | ICD-10-CM | POA: Diagnosis not present

## 2017-10-11 DIAGNOSIS — M6281 Muscle weakness (generalized): Secondary | ICD-10-CM | POA: Diagnosis not present

## 2017-10-12 DIAGNOSIS — D649 Anemia, unspecified: Secondary | ICD-10-CM | POA: Diagnosis not present

## 2017-10-12 DIAGNOSIS — F0281 Dementia in other diseases classified elsewhere with behavioral disturbance: Secondary | ICD-10-CM | POA: Diagnosis not present

## 2017-10-12 DIAGNOSIS — G301 Alzheimer's disease with late onset: Secondary | ICD-10-CM | POA: Diagnosis not present

## 2017-10-12 DIAGNOSIS — R1312 Dysphagia, oropharyngeal phase: Secondary | ICD-10-CM | POA: Diagnosis not present

## 2017-10-12 DIAGNOSIS — M6281 Muscle weakness (generalized): Secondary | ICD-10-CM | POA: Diagnosis not present

## 2017-10-12 DIAGNOSIS — I63541 Cerebral infarction due to unspecified occlusion or stenosis of right cerebellar artery: Secondary | ICD-10-CM | POA: Diagnosis not present

## 2017-10-13 DIAGNOSIS — R1312 Dysphagia, oropharyngeal phase: Secondary | ICD-10-CM | POA: Diagnosis not present

## 2017-10-13 DIAGNOSIS — I63541 Cerebral infarction due to unspecified occlusion or stenosis of right cerebellar artery: Secondary | ICD-10-CM | POA: Diagnosis not present

## 2017-10-13 DIAGNOSIS — F0281 Dementia in other diseases classified elsewhere with behavioral disturbance: Secondary | ICD-10-CM | POA: Diagnosis not present

## 2017-10-13 DIAGNOSIS — M6281 Muscle weakness (generalized): Secondary | ICD-10-CM | POA: Diagnosis not present

## 2017-10-13 DIAGNOSIS — D649 Anemia, unspecified: Secondary | ICD-10-CM | POA: Diagnosis not present

## 2017-10-13 DIAGNOSIS — G301 Alzheimer's disease with late onset: Secondary | ICD-10-CM | POA: Diagnosis not present

## 2017-10-15 DIAGNOSIS — I63541 Cerebral infarction due to unspecified occlusion or stenosis of right cerebellar artery: Secondary | ICD-10-CM | POA: Diagnosis not present

## 2017-10-15 DIAGNOSIS — R1312 Dysphagia, oropharyngeal phase: Secondary | ICD-10-CM | POA: Diagnosis not present

## 2017-10-15 DIAGNOSIS — M6281 Muscle weakness (generalized): Secondary | ICD-10-CM | POA: Diagnosis not present

## 2017-10-15 DIAGNOSIS — F0281 Dementia in other diseases classified elsewhere with behavioral disturbance: Secondary | ICD-10-CM | POA: Diagnosis not present

## 2017-10-15 DIAGNOSIS — F321 Major depressive disorder, single episode, moderate: Secondary | ICD-10-CM | POA: Diagnosis not present

## 2017-10-15 DIAGNOSIS — D649 Anemia, unspecified: Secondary | ICD-10-CM | POA: Diagnosis not present

## 2017-10-15 DIAGNOSIS — G301 Alzheimer's disease with late onset: Secondary | ICD-10-CM | POA: Diagnosis not present

## 2017-10-15 DIAGNOSIS — F411 Generalized anxiety disorder: Secondary | ICD-10-CM | POA: Diagnosis not present

## 2017-10-18 DIAGNOSIS — F0281 Dementia in other diseases classified elsewhere with behavioral disturbance: Secondary | ICD-10-CM | POA: Diagnosis not present

## 2017-10-18 DIAGNOSIS — I63541 Cerebral infarction due to unspecified occlusion or stenosis of right cerebellar artery: Secondary | ICD-10-CM | POA: Diagnosis not present

## 2017-10-18 DIAGNOSIS — D649 Anemia, unspecified: Secondary | ICD-10-CM | POA: Diagnosis not present

## 2017-10-18 DIAGNOSIS — G301 Alzheimer's disease with late onset: Secondary | ICD-10-CM | POA: Diagnosis not present

## 2017-10-18 DIAGNOSIS — M6281 Muscle weakness (generalized): Secondary | ICD-10-CM | POA: Diagnosis not present

## 2017-10-18 DIAGNOSIS — R1312 Dysphagia, oropharyngeal phase: Secondary | ICD-10-CM | POA: Diagnosis not present

## 2017-10-19 DIAGNOSIS — G301 Alzheimer's disease with late onset: Secondary | ICD-10-CM | POA: Diagnosis not present

## 2017-10-19 DIAGNOSIS — I63541 Cerebral infarction due to unspecified occlusion or stenosis of right cerebellar artery: Secondary | ICD-10-CM | POA: Diagnosis not present

## 2017-10-19 DIAGNOSIS — M6281 Muscle weakness (generalized): Secondary | ICD-10-CM | POA: Diagnosis not present

## 2017-10-19 DIAGNOSIS — R1312 Dysphagia, oropharyngeal phase: Secondary | ICD-10-CM | POA: Diagnosis not present

## 2017-10-19 DIAGNOSIS — F0281 Dementia in other diseases classified elsewhere with behavioral disturbance: Secondary | ICD-10-CM | POA: Diagnosis not present

## 2017-10-19 DIAGNOSIS — D649 Anemia, unspecified: Secondary | ICD-10-CM | POA: Diagnosis not present

## 2017-10-20 DIAGNOSIS — I63541 Cerebral infarction due to unspecified occlusion or stenosis of right cerebellar artery: Secondary | ICD-10-CM | POA: Diagnosis not present

## 2017-10-20 DIAGNOSIS — F0281 Dementia in other diseases classified elsewhere with behavioral disturbance: Secondary | ICD-10-CM | POA: Diagnosis not present

## 2017-10-20 DIAGNOSIS — M6281 Muscle weakness (generalized): Secondary | ICD-10-CM | POA: Diagnosis not present

## 2017-10-20 DIAGNOSIS — D649 Anemia, unspecified: Secondary | ICD-10-CM | POA: Diagnosis not present

## 2017-10-20 DIAGNOSIS — R1312 Dysphagia, oropharyngeal phase: Secondary | ICD-10-CM | POA: Diagnosis not present

## 2017-10-20 DIAGNOSIS — G301 Alzheimer's disease with late onset: Secondary | ICD-10-CM | POA: Diagnosis not present

## 2017-10-22 DIAGNOSIS — M6281 Muscle weakness (generalized): Secondary | ICD-10-CM | POA: Diagnosis not present

## 2017-10-22 DIAGNOSIS — F0281 Dementia in other diseases classified elsewhere with behavioral disturbance: Secondary | ICD-10-CM | POA: Diagnosis not present

## 2017-10-22 DIAGNOSIS — G301 Alzheimer's disease with late onset: Secondary | ICD-10-CM | POA: Diagnosis not present

## 2017-10-22 DIAGNOSIS — D649 Anemia, unspecified: Secondary | ICD-10-CM | POA: Diagnosis not present

## 2017-10-22 DIAGNOSIS — I63541 Cerebral infarction due to unspecified occlusion or stenosis of right cerebellar artery: Secondary | ICD-10-CM | POA: Diagnosis not present

## 2017-10-22 DIAGNOSIS — R1312 Dysphagia, oropharyngeal phase: Secondary | ICD-10-CM | POA: Diagnosis not present

## 2017-10-23 DIAGNOSIS — M25551 Pain in right hip: Secondary | ICD-10-CM | POA: Diagnosis not present

## 2017-10-23 DIAGNOSIS — M25552 Pain in left hip: Secondary | ICD-10-CM | POA: Diagnosis not present

## 2017-10-24 DIAGNOSIS — R1310 Dysphagia, unspecified: Secondary | ICD-10-CM | POA: Diagnosis not present

## 2017-10-24 DIAGNOSIS — I63541 Cerebral infarction due to unspecified occlusion or stenosis of right cerebellar artery: Secondary | ICD-10-CM | POA: Diagnosis not present

## 2017-10-24 DIAGNOSIS — I10 Essential (primary) hypertension: Secondary | ICD-10-CM | POA: Diagnosis not present

## 2017-10-24 DIAGNOSIS — D508 Other iron deficiency anemias: Secondary | ICD-10-CM | POA: Diagnosis not present

## 2017-10-26 DIAGNOSIS — F0281 Dementia in other diseases classified elsewhere with behavioral disturbance: Secondary | ICD-10-CM | POA: Diagnosis not present

## 2017-10-26 DIAGNOSIS — M6281 Muscle weakness (generalized): Secondary | ICD-10-CM | POA: Diagnosis not present

## 2017-10-26 DIAGNOSIS — R1312 Dysphagia, oropharyngeal phase: Secondary | ICD-10-CM | POA: Diagnosis not present

## 2017-10-26 DIAGNOSIS — G301 Alzheimer's disease with late onset: Secondary | ICD-10-CM | POA: Diagnosis not present

## 2017-10-26 DIAGNOSIS — I63541 Cerebral infarction due to unspecified occlusion or stenosis of right cerebellar artery: Secondary | ICD-10-CM | POA: Diagnosis not present

## 2017-10-26 DIAGNOSIS — D649 Anemia, unspecified: Secondary | ICD-10-CM | POA: Diagnosis not present

## 2017-10-27 DIAGNOSIS — G301 Alzheimer's disease with late onset: Secondary | ICD-10-CM | POA: Diagnosis not present

## 2017-10-27 DIAGNOSIS — D649 Anemia, unspecified: Secondary | ICD-10-CM | POA: Diagnosis not present

## 2017-10-27 DIAGNOSIS — F0281 Dementia in other diseases classified elsewhere with behavioral disturbance: Secondary | ICD-10-CM | POA: Diagnosis not present

## 2017-10-27 DIAGNOSIS — M6281 Muscle weakness (generalized): Secondary | ICD-10-CM | POA: Diagnosis not present

## 2017-10-27 DIAGNOSIS — I63541 Cerebral infarction due to unspecified occlusion or stenosis of right cerebellar artery: Secondary | ICD-10-CM | POA: Diagnosis not present

## 2017-10-27 DIAGNOSIS — R1312 Dysphagia, oropharyngeal phase: Secondary | ICD-10-CM | POA: Diagnosis not present

## 2017-10-29 DIAGNOSIS — I63541 Cerebral infarction due to unspecified occlusion or stenosis of right cerebellar artery: Secondary | ICD-10-CM | POA: Diagnosis not present

## 2017-10-29 DIAGNOSIS — F0281 Dementia in other diseases classified elsewhere with behavioral disturbance: Secondary | ICD-10-CM | POA: Diagnosis not present

## 2017-10-29 DIAGNOSIS — R1312 Dysphagia, oropharyngeal phase: Secondary | ICD-10-CM | POA: Diagnosis not present

## 2017-10-29 DIAGNOSIS — M6281 Muscle weakness (generalized): Secondary | ICD-10-CM | POA: Diagnosis not present

## 2017-10-29 DIAGNOSIS — G301 Alzheimer's disease with late onset: Secondary | ICD-10-CM | POA: Diagnosis not present

## 2017-10-29 DIAGNOSIS — D649 Anemia, unspecified: Secondary | ICD-10-CM | POA: Diagnosis not present

## 2017-10-30 IMAGING — DX DG CHEST 2V
2 series · 2 of 2 positions shown · non-contrast
Comparison: None.

CLINICAL DATA: Mental status changes. Fall trying to get into
shower. Pain.

EXAM:
CHEST  2 VIEW

[chest lat]
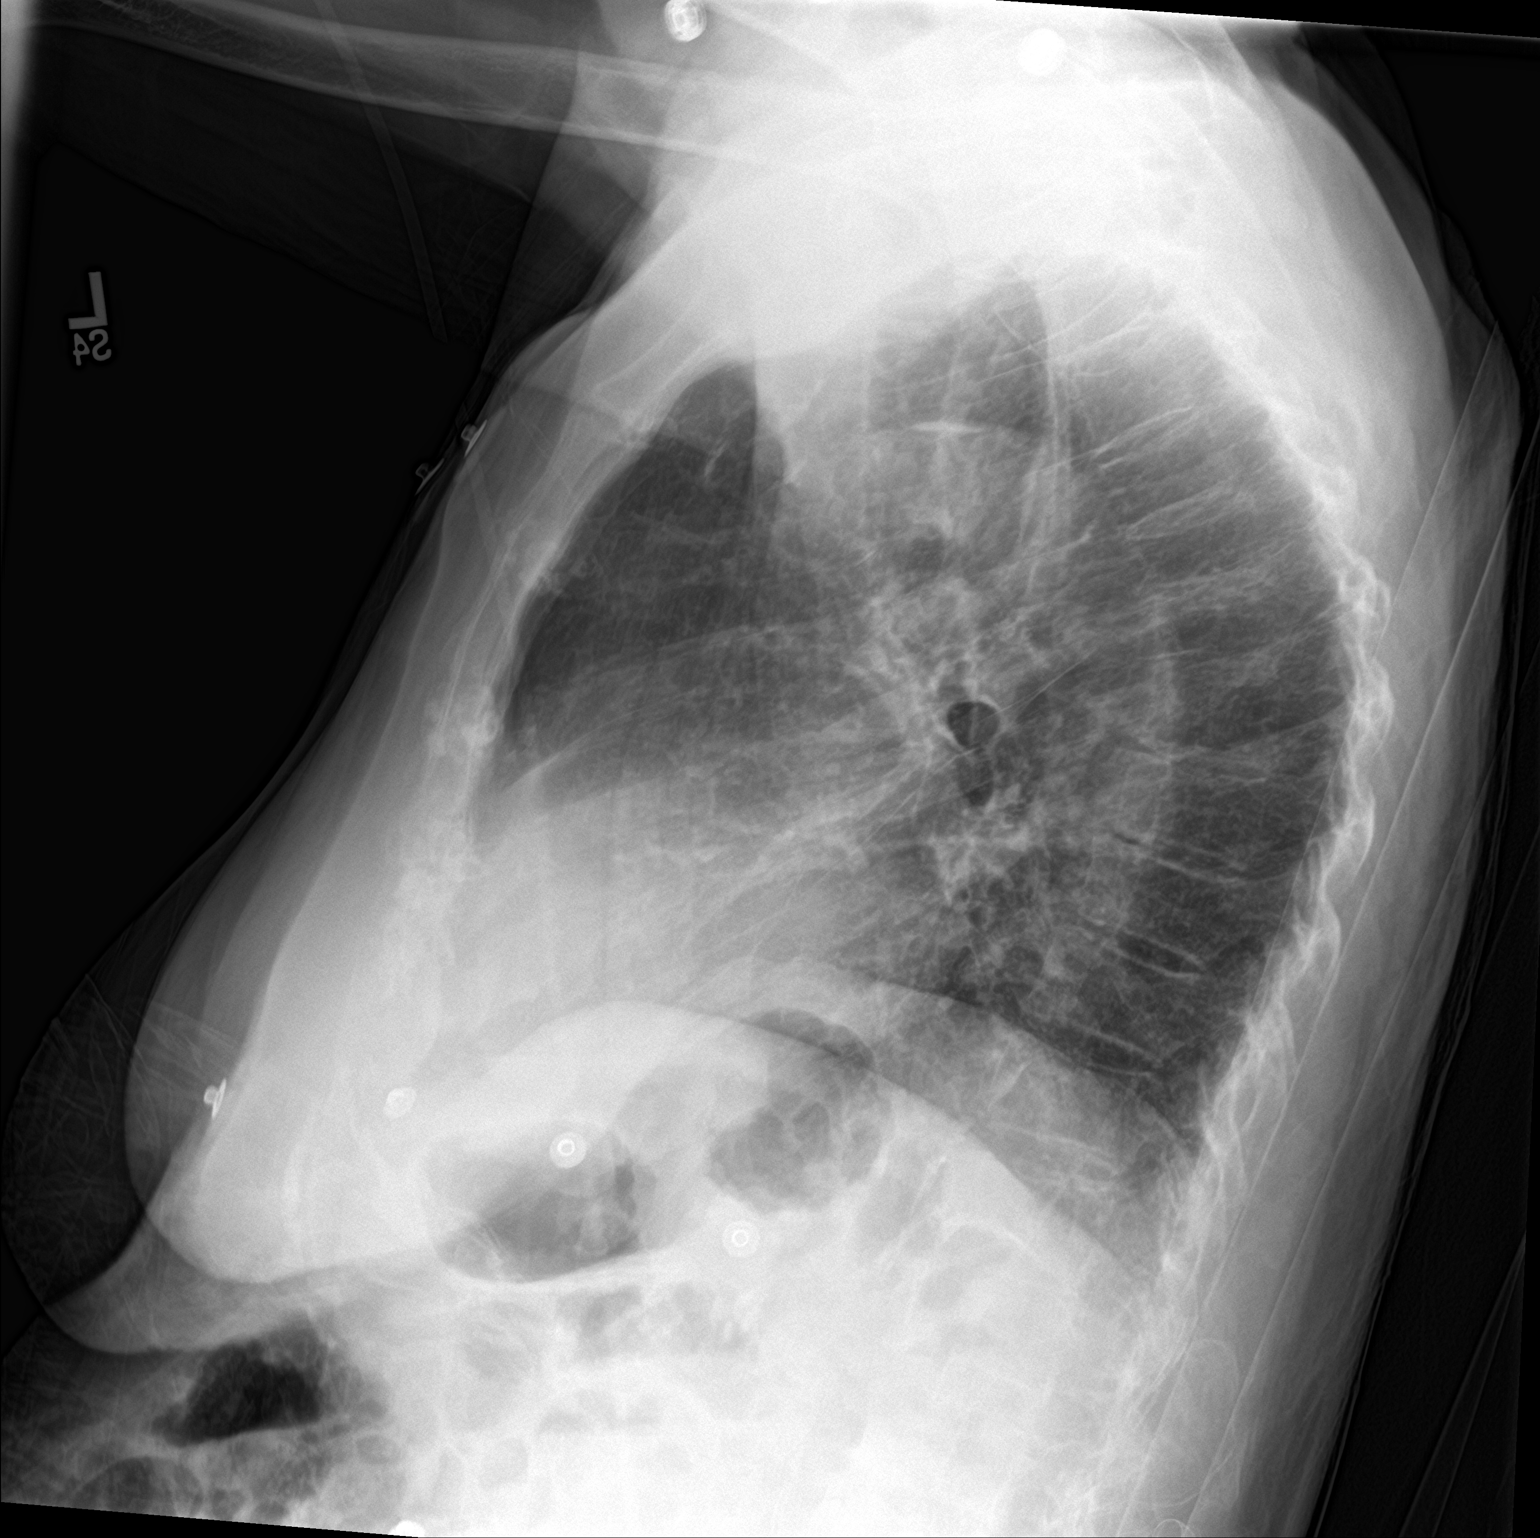

[chest ap]
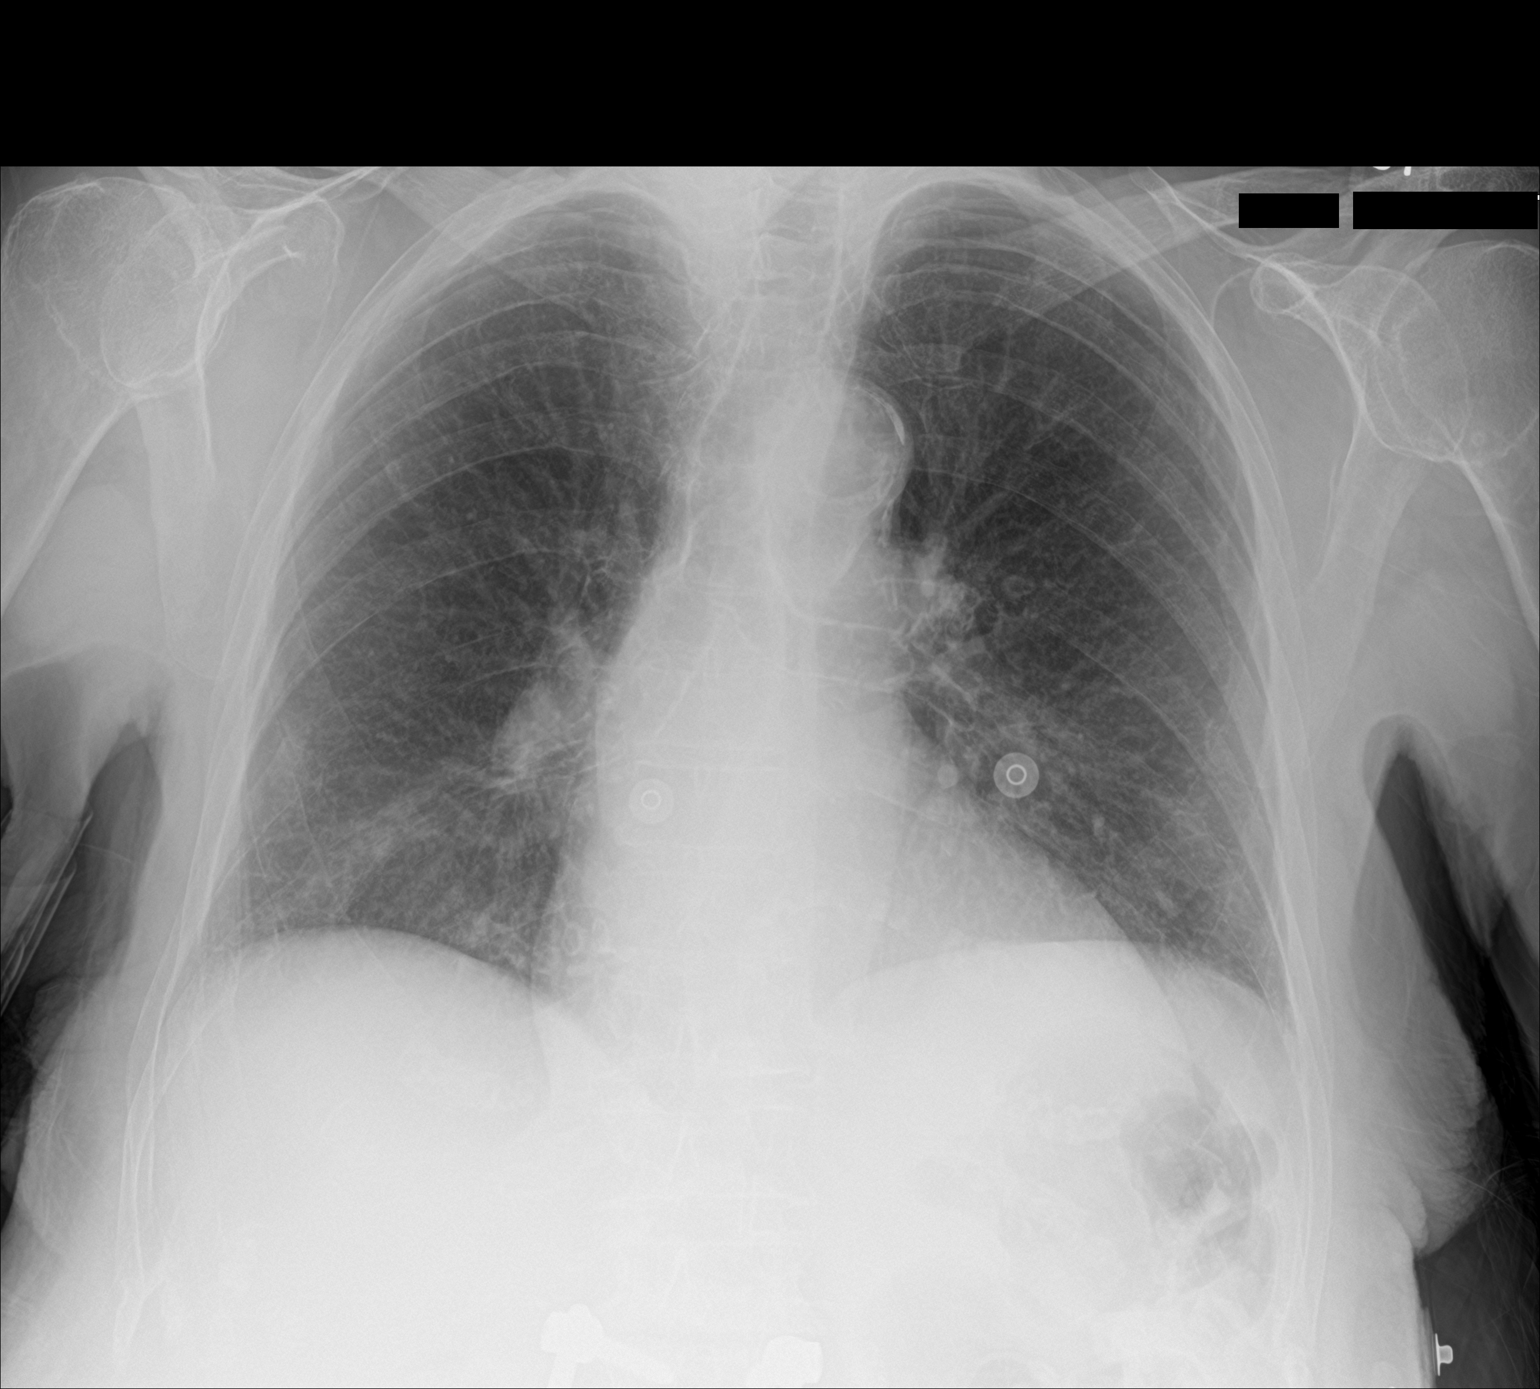

[2 of 2 positions shown; findings below may reference images not displayed]

FINDINGS: Heart is mildly enlarged. Atherosclerotic calcifications are present
at the aortic arch. Mild interstitial coarsening is likely chronic.
Superimposed ill-defined airspace disease is most prominent within
the right middle lobe. There is mild bibasilar atelectasis is well.
Exaggerated kyphosis is present. Marked osteopenia is noted. The
visualized soft tissues and bony thorax are otherwise unremarkable.
IMPRESSION: 1. Ill-defined right middle lobe airspace disease and minimal
bibasilar disease likely reflects atelectasis. Early infection is
also considered.
2. Some element of chronic interstitial coarsening is present as
well.
3. Borderline cardiomegaly without failure.
4. Atherosclerosis of the thoracic aorta.

## 2017-11-01 DIAGNOSIS — D649 Anemia, unspecified: Secondary | ICD-10-CM | POA: Diagnosis not present

## 2017-11-01 DIAGNOSIS — I63541 Cerebral infarction due to unspecified occlusion or stenosis of right cerebellar artery: Secondary | ICD-10-CM | POA: Diagnosis not present

## 2017-11-01 DIAGNOSIS — R1312 Dysphagia, oropharyngeal phase: Secondary | ICD-10-CM | POA: Diagnosis not present

## 2017-11-01 DIAGNOSIS — G301 Alzheimer's disease with late onset: Secondary | ICD-10-CM | POA: Diagnosis not present

## 2017-11-01 DIAGNOSIS — F0281 Dementia in other diseases classified elsewhere with behavioral disturbance: Secondary | ICD-10-CM | POA: Diagnosis not present

## 2017-11-01 DIAGNOSIS — M6281 Muscle weakness (generalized): Secondary | ICD-10-CM | POA: Diagnosis not present

## 2017-11-02 DIAGNOSIS — F0281 Dementia in other diseases classified elsewhere with behavioral disturbance: Secondary | ICD-10-CM | POA: Diagnosis not present

## 2017-11-02 DIAGNOSIS — D649 Anemia, unspecified: Secondary | ICD-10-CM | POA: Diagnosis not present

## 2017-11-02 DIAGNOSIS — R1312 Dysphagia, oropharyngeal phase: Secondary | ICD-10-CM | POA: Diagnosis not present

## 2017-11-02 DIAGNOSIS — M6281 Muscle weakness (generalized): Secondary | ICD-10-CM | POA: Diagnosis not present

## 2017-11-02 DIAGNOSIS — I63541 Cerebral infarction due to unspecified occlusion or stenosis of right cerebellar artery: Secondary | ICD-10-CM | POA: Diagnosis not present

## 2017-11-02 DIAGNOSIS — G301 Alzheimer's disease with late onset: Secondary | ICD-10-CM | POA: Diagnosis not present

## 2017-11-03 DIAGNOSIS — F0281 Dementia in other diseases classified elsewhere with behavioral disturbance: Secondary | ICD-10-CM | POA: Diagnosis not present

## 2017-11-03 DIAGNOSIS — D649 Anemia, unspecified: Secondary | ICD-10-CM | POA: Diagnosis not present

## 2017-11-03 DIAGNOSIS — G301 Alzheimer's disease with late onset: Secondary | ICD-10-CM | POA: Diagnosis not present

## 2017-11-03 DIAGNOSIS — M6281 Muscle weakness (generalized): Secondary | ICD-10-CM | POA: Diagnosis not present

## 2017-11-03 DIAGNOSIS — I63541 Cerebral infarction due to unspecified occlusion or stenosis of right cerebellar artery: Secondary | ICD-10-CM | POA: Diagnosis not present

## 2017-11-03 DIAGNOSIS — R1312 Dysphagia, oropharyngeal phase: Secondary | ICD-10-CM | POA: Diagnosis not present

## 2017-11-04 DIAGNOSIS — G301 Alzheimer's disease with late onset: Secondary | ICD-10-CM | POA: Diagnosis not present

## 2017-11-04 DIAGNOSIS — D649 Anemia, unspecified: Secondary | ICD-10-CM | POA: Diagnosis not present

## 2017-11-04 DIAGNOSIS — I63541 Cerebral infarction due to unspecified occlusion or stenosis of right cerebellar artery: Secondary | ICD-10-CM | POA: Diagnosis not present

## 2017-11-04 DIAGNOSIS — F0281 Dementia in other diseases classified elsewhere with behavioral disturbance: Secondary | ICD-10-CM | POA: Diagnosis not present

## 2017-11-04 DIAGNOSIS — M6281 Muscle weakness (generalized): Secondary | ICD-10-CM | POA: Diagnosis not present

## 2017-11-04 DIAGNOSIS — R1312 Dysphagia, oropharyngeal phase: Secondary | ICD-10-CM | POA: Diagnosis not present

## 2017-11-07 DIAGNOSIS — F321 Major depressive disorder, single episode, moderate: Secondary | ICD-10-CM | POA: Diagnosis not present

## 2017-11-07 DIAGNOSIS — G301 Alzheimer's disease with late onset: Secondary | ICD-10-CM | POA: Diagnosis not present

## 2017-11-07 DIAGNOSIS — D508 Other iron deficiency anemias: Secondary | ICD-10-CM | POA: Diagnosis not present

## 2017-11-07 DIAGNOSIS — I63541 Cerebral infarction due to unspecified occlusion or stenosis of right cerebellar artery: Secondary | ICD-10-CM | POA: Diagnosis not present

## 2017-11-07 DIAGNOSIS — F0151 Vascular dementia with behavioral disturbance: Secondary | ICD-10-CM | POA: Diagnosis not present

## 2017-11-07 DIAGNOSIS — I10 Essential (primary) hypertension: Secondary | ICD-10-CM | POA: Diagnosis not present

## 2017-11-07 DIAGNOSIS — F482 Pseudobulbar affect: Secondary | ICD-10-CM | POA: Diagnosis not present

## 2017-11-07 DIAGNOSIS — M6281 Muscle weakness (generalized): Secondary | ICD-10-CM | POA: Diagnosis not present

## 2017-11-07 DIAGNOSIS — I483 Typical atrial flutter: Secondary | ICD-10-CM | POA: Diagnosis not present

## 2017-11-07 DIAGNOSIS — F0281 Dementia in other diseases classified elsewhere with behavioral disturbance: Secondary | ICD-10-CM | POA: Diagnosis not present

## 2017-11-07 DIAGNOSIS — F411 Generalized anxiety disorder: Secondary | ICD-10-CM | POA: Diagnosis not present

## 2017-11-07 DIAGNOSIS — K219 Gastro-esophageal reflux disease without esophagitis: Secondary | ICD-10-CM | POA: Diagnosis not present

## 2017-11-07 DIAGNOSIS — R1312 Dysphagia, oropharyngeal phase: Secondary | ICD-10-CM | POA: Diagnosis not present

## 2017-11-07 DIAGNOSIS — D649 Anemia, unspecified: Secondary | ICD-10-CM | POA: Diagnosis not present

## 2017-11-10 DIAGNOSIS — D649 Anemia, unspecified: Secondary | ICD-10-CM | POA: Diagnosis not present

## 2017-11-10 DIAGNOSIS — G301 Alzheimer's disease with late onset: Secondary | ICD-10-CM | POA: Diagnosis not present

## 2017-11-10 DIAGNOSIS — F0281 Dementia in other diseases classified elsewhere with behavioral disturbance: Secondary | ICD-10-CM | POA: Diagnosis not present

## 2017-11-10 DIAGNOSIS — R1312 Dysphagia, oropharyngeal phase: Secondary | ICD-10-CM | POA: Diagnosis not present

## 2017-11-10 DIAGNOSIS — I63541 Cerebral infarction due to unspecified occlusion or stenosis of right cerebellar artery: Secondary | ICD-10-CM | POA: Diagnosis not present

## 2017-11-10 DIAGNOSIS — M6281 Muscle weakness (generalized): Secondary | ICD-10-CM | POA: Diagnosis not present

## 2017-11-12 DIAGNOSIS — M6281 Muscle weakness (generalized): Secondary | ICD-10-CM | POA: Diagnosis not present

## 2017-11-12 DIAGNOSIS — G301 Alzheimer's disease with late onset: Secondary | ICD-10-CM | POA: Diagnosis not present

## 2017-11-12 DIAGNOSIS — I63541 Cerebral infarction due to unspecified occlusion or stenosis of right cerebellar artery: Secondary | ICD-10-CM | POA: Diagnosis not present

## 2017-11-12 DIAGNOSIS — F0281 Dementia in other diseases classified elsewhere with behavioral disturbance: Secondary | ICD-10-CM | POA: Diagnosis not present

## 2017-11-12 DIAGNOSIS — D649 Anemia, unspecified: Secondary | ICD-10-CM | POA: Diagnosis not present

## 2017-11-12 DIAGNOSIS — R1312 Dysphagia, oropharyngeal phase: Secondary | ICD-10-CM | POA: Diagnosis not present

## 2017-11-15 DIAGNOSIS — F419 Anxiety disorder, unspecified: Secondary | ICD-10-CM | POA: Diagnosis not present

## 2017-11-15 DIAGNOSIS — R269 Unspecified abnormalities of gait and mobility: Secondary | ICD-10-CM | POA: Diagnosis not present

## 2017-11-15 DIAGNOSIS — G301 Alzheimer's disease with late onset: Secondary | ICD-10-CM | POA: Diagnosis not present

## 2017-11-15 DIAGNOSIS — M6281 Muscle weakness (generalized): Secondary | ICD-10-CM | POA: Diagnosis not present

## 2017-11-16 DIAGNOSIS — F0281 Dementia in other diseases classified elsewhere with behavioral disturbance: Secondary | ICD-10-CM | POA: Diagnosis not present

## 2017-11-16 DIAGNOSIS — D649 Anemia, unspecified: Secondary | ICD-10-CM | POA: Diagnosis not present

## 2017-11-16 DIAGNOSIS — G301 Alzheimer's disease with late onset: Secondary | ICD-10-CM | POA: Diagnosis not present

## 2017-11-16 DIAGNOSIS — I63541 Cerebral infarction due to unspecified occlusion or stenosis of right cerebellar artery: Secondary | ICD-10-CM | POA: Diagnosis not present

## 2017-11-16 DIAGNOSIS — M6281 Muscle weakness (generalized): Secondary | ICD-10-CM | POA: Diagnosis not present

## 2017-11-16 DIAGNOSIS — R1312 Dysphagia, oropharyngeal phase: Secondary | ICD-10-CM | POA: Diagnosis not present

## 2017-11-17 DIAGNOSIS — D649 Anemia, unspecified: Secondary | ICD-10-CM | POA: Diagnosis not present

## 2017-11-17 DIAGNOSIS — M6281 Muscle weakness (generalized): Secondary | ICD-10-CM | POA: Diagnosis not present

## 2017-11-17 DIAGNOSIS — G301 Alzheimer's disease with late onset: Secondary | ICD-10-CM | POA: Diagnosis not present

## 2017-11-17 DIAGNOSIS — I63541 Cerebral infarction due to unspecified occlusion or stenosis of right cerebellar artery: Secondary | ICD-10-CM | POA: Diagnosis not present

## 2017-11-17 DIAGNOSIS — F0281 Dementia in other diseases classified elsewhere with behavioral disturbance: Secondary | ICD-10-CM | POA: Diagnosis not present

## 2017-11-17 DIAGNOSIS — R1312 Dysphagia, oropharyngeal phase: Secondary | ICD-10-CM | POA: Diagnosis not present

## 2017-11-18 DIAGNOSIS — M6281 Muscle weakness (generalized): Secondary | ICD-10-CM | POA: Diagnosis not present

## 2017-11-18 DIAGNOSIS — R1312 Dysphagia, oropharyngeal phase: Secondary | ICD-10-CM | POA: Diagnosis not present

## 2017-11-18 DIAGNOSIS — D649 Anemia, unspecified: Secondary | ICD-10-CM | POA: Diagnosis not present

## 2017-11-18 DIAGNOSIS — F0281 Dementia in other diseases classified elsewhere with behavioral disturbance: Secondary | ICD-10-CM | POA: Diagnosis not present

## 2017-11-18 DIAGNOSIS — I63541 Cerebral infarction due to unspecified occlusion or stenosis of right cerebellar artery: Secondary | ICD-10-CM | POA: Diagnosis not present

## 2017-11-18 DIAGNOSIS — G301 Alzheimer's disease with late onset: Secondary | ICD-10-CM | POA: Diagnosis not present

## 2017-11-19 DIAGNOSIS — F0281 Dementia in other diseases classified elsewhere with behavioral disturbance: Secondary | ICD-10-CM | POA: Diagnosis not present

## 2017-11-19 DIAGNOSIS — D649 Anemia, unspecified: Secondary | ICD-10-CM | POA: Diagnosis not present

## 2017-11-19 DIAGNOSIS — R1312 Dysphagia, oropharyngeal phase: Secondary | ICD-10-CM | POA: Diagnosis not present

## 2017-11-19 DIAGNOSIS — M6281 Muscle weakness (generalized): Secondary | ICD-10-CM | POA: Diagnosis not present

## 2017-11-19 DIAGNOSIS — I63541 Cerebral infarction due to unspecified occlusion or stenosis of right cerebellar artery: Secondary | ICD-10-CM | POA: Diagnosis not present

## 2017-11-19 DIAGNOSIS — G301 Alzheimer's disease with late onset: Secondary | ICD-10-CM | POA: Diagnosis not present

## 2017-11-22 DIAGNOSIS — F0281 Dementia in other diseases classified elsewhere with behavioral disturbance: Secondary | ICD-10-CM | POA: Diagnosis not present

## 2017-11-22 DIAGNOSIS — I63541 Cerebral infarction due to unspecified occlusion or stenosis of right cerebellar artery: Secondary | ICD-10-CM | POA: Diagnosis not present

## 2017-11-22 DIAGNOSIS — G301 Alzheimer's disease with late onset: Secondary | ICD-10-CM | POA: Diagnosis not present

## 2017-11-22 DIAGNOSIS — D649 Anemia, unspecified: Secondary | ICD-10-CM | POA: Diagnosis not present

## 2017-11-22 DIAGNOSIS — R1312 Dysphagia, oropharyngeal phase: Secondary | ICD-10-CM | POA: Diagnosis not present

## 2017-11-22 DIAGNOSIS — M6281 Muscle weakness (generalized): Secondary | ICD-10-CM | POA: Diagnosis not present

## 2017-11-23 DIAGNOSIS — I63541 Cerebral infarction due to unspecified occlusion or stenosis of right cerebellar artery: Secondary | ICD-10-CM | POA: Diagnosis not present

## 2017-11-23 DIAGNOSIS — G301 Alzheimer's disease with late onset: Secondary | ICD-10-CM | POA: Diagnosis not present

## 2017-11-23 DIAGNOSIS — R1312 Dysphagia, oropharyngeal phase: Secondary | ICD-10-CM | POA: Diagnosis not present

## 2017-11-23 DIAGNOSIS — F0281 Dementia in other diseases classified elsewhere with behavioral disturbance: Secondary | ICD-10-CM | POA: Diagnosis not present

## 2017-11-23 DIAGNOSIS — D649 Anemia, unspecified: Secondary | ICD-10-CM | POA: Diagnosis not present

## 2017-11-23 DIAGNOSIS — M6281 Muscle weakness (generalized): Secondary | ICD-10-CM | POA: Diagnosis not present

## 2017-11-24 DIAGNOSIS — D649 Anemia, unspecified: Secondary | ICD-10-CM | POA: Diagnosis not present

## 2017-11-24 DIAGNOSIS — G301 Alzheimer's disease with late onset: Secondary | ICD-10-CM | POA: Diagnosis not present

## 2017-11-24 DIAGNOSIS — F0281 Dementia in other diseases classified elsewhere with behavioral disturbance: Secondary | ICD-10-CM | POA: Diagnosis not present

## 2017-11-24 DIAGNOSIS — I63541 Cerebral infarction due to unspecified occlusion or stenosis of right cerebellar artery: Secondary | ICD-10-CM | POA: Diagnosis not present

## 2017-11-24 DIAGNOSIS — M6281 Muscle weakness (generalized): Secondary | ICD-10-CM | POA: Diagnosis not present

## 2017-11-24 DIAGNOSIS — R1312 Dysphagia, oropharyngeal phase: Secondary | ICD-10-CM | POA: Diagnosis not present

## 2017-11-26 DIAGNOSIS — G301 Alzheimer's disease with late onset: Secondary | ICD-10-CM | POA: Diagnosis not present

## 2017-11-26 DIAGNOSIS — I63541 Cerebral infarction due to unspecified occlusion or stenosis of right cerebellar artery: Secondary | ICD-10-CM | POA: Diagnosis not present

## 2017-11-26 DIAGNOSIS — R1312 Dysphagia, oropharyngeal phase: Secondary | ICD-10-CM | POA: Diagnosis not present

## 2017-11-26 DIAGNOSIS — F0281 Dementia in other diseases classified elsewhere with behavioral disturbance: Secondary | ICD-10-CM | POA: Diagnosis not present

## 2017-11-26 DIAGNOSIS — M6281 Muscle weakness (generalized): Secondary | ICD-10-CM | POA: Diagnosis not present

## 2017-11-26 DIAGNOSIS — D649 Anemia, unspecified: Secondary | ICD-10-CM | POA: Diagnosis not present

## 2017-11-29 DIAGNOSIS — D649 Anemia, unspecified: Secondary | ICD-10-CM | POA: Diagnosis not present

## 2017-11-29 DIAGNOSIS — F0281 Dementia in other diseases classified elsewhere with behavioral disturbance: Secondary | ICD-10-CM | POA: Diagnosis not present

## 2017-11-29 DIAGNOSIS — I63541 Cerebral infarction due to unspecified occlusion or stenosis of right cerebellar artery: Secondary | ICD-10-CM | POA: Diagnosis not present

## 2017-11-29 DIAGNOSIS — M6281 Muscle weakness (generalized): Secondary | ICD-10-CM | POA: Diagnosis not present

## 2017-11-29 DIAGNOSIS — R1312 Dysphagia, oropharyngeal phase: Secondary | ICD-10-CM | POA: Diagnosis not present

## 2017-11-29 DIAGNOSIS — G301 Alzheimer's disease with late onset: Secondary | ICD-10-CM | POA: Diagnosis not present

## 2017-11-30 DIAGNOSIS — S199XXA Unspecified injury of neck, initial encounter: Secondary | ICD-10-CM | POA: Diagnosis not present

## 2017-11-30 DIAGNOSIS — S01512A Laceration without foreign body of oral cavity, initial encounter: Secondary | ICD-10-CM | POA: Diagnosis not present

## 2017-11-30 DIAGNOSIS — S0990XA Unspecified injury of head, initial encounter: Secondary | ICD-10-CM | POA: Diagnosis not present

## 2017-11-30 DIAGNOSIS — R404 Transient alteration of awareness: Secondary | ICD-10-CM | POA: Diagnosis not present

## 2017-11-30 DIAGNOSIS — W19XXXA Unspecified fall, initial encounter: Secondary | ICD-10-CM | POA: Diagnosis not present

## 2017-11-30 DIAGNOSIS — S0993XA Unspecified injury of face, initial encounter: Secondary | ICD-10-CM | POA: Diagnosis not present

## 2017-11-30 DIAGNOSIS — S022XXA Fracture of nasal bones, initial encounter for closed fracture: Secondary | ICD-10-CM | POA: Diagnosis not present

## 2017-11-30 DIAGNOSIS — S0091XA Abrasion of unspecified part of head, initial encounter: Secondary | ICD-10-CM | POA: Diagnosis not present

## 2017-12-01 DIAGNOSIS — R1312 Dysphagia, oropharyngeal phase: Secondary | ICD-10-CM | POA: Diagnosis not present

## 2017-12-01 DIAGNOSIS — D649 Anemia, unspecified: Secondary | ICD-10-CM | POA: Diagnosis not present

## 2017-12-01 DIAGNOSIS — R269 Unspecified abnormalities of gait and mobility: Secondary | ICD-10-CM | POA: Diagnosis not present

## 2017-12-01 DIAGNOSIS — I63541 Cerebral infarction due to unspecified occlusion or stenosis of right cerebellar artery: Secondary | ICD-10-CM | POA: Diagnosis not present

## 2017-12-01 DIAGNOSIS — F0281 Dementia in other diseases classified elsewhere with behavioral disturbance: Secondary | ICD-10-CM | POA: Diagnosis not present

## 2017-12-01 DIAGNOSIS — S01511A Laceration without foreign body of lip, initial encounter: Secondary | ICD-10-CM | POA: Diagnosis not present

## 2017-12-01 DIAGNOSIS — G301 Alzheimer's disease with late onset: Secondary | ICD-10-CM | POA: Diagnosis not present

## 2017-12-01 DIAGNOSIS — M6281 Muscle weakness (generalized): Secondary | ICD-10-CM | POA: Diagnosis not present

## 2017-12-02 DIAGNOSIS — D649 Anemia, unspecified: Secondary | ICD-10-CM | POA: Diagnosis not present

## 2017-12-02 DIAGNOSIS — M6281 Muscle weakness (generalized): Secondary | ICD-10-CM | POA: Diagnosis not present

## 2017-12-02 DIAGNOSIS — F0281 Dementia in other diseases classified elsewhere with behavioral disturbance: Secondary | ICD-10-CM | POA: Diagnosis not present

## 2017-12-02 DIAGNOSIS — I63541 Cerebral infarction due to unspecified occlusion or stenosis of right cerebellar artery: Secondary | ICD-10-CM | POA: Diagnosis not present

## 2017-12-02 DIAGNOSIS — R1312 Dysphagia, oropharyngeal phase: Secondary | ICD-10-CM | POA: Diagnosis not present

## 2017-12-02 DIAGNOSIS — G301 Alzheimer's disease with late onset: Secondary | ICD-10-CM | POA: Diagnosis not present

## 2017-12-03 DIAGNOSIS — F0281 Dementia in other diseases classified elsewhere with behavioral disturbance: Secondary | ICD-10-CM | POA: Diagnosis not present

## 2017-12-03 DIAGNOSIS — G301 Alzheimer's disease with late onset: Secondary | ICD-10-CM | POA: Diagnosis not present

## 2017-12-03 DIAGNOSIS — I63541 Cerebral infarction due to unspecified occlusion or stenosis of right cerebellar artery: Secondary | ICD-10-CM | POA: Diagnosis not present

## 2017-12-03 DIAGNOSIS — D649 Anemia, unspecified: Secondary | ICD-10-CM | POA: Diagnosis not present

## 2017-12-03 DIAGNOSIS — F321 Major depressive disorder, single episode, moderate: Secondary | ICD-10-CM | POA: Diagnosis not present

## 2017-12-03 DIAGNOSIS — R1312 Dysphagia, oropharyngeal phase: Secondary | ICD-10-CM | POA: Diagnosis not present

## 2017-12-03 DIAGNOSIS — M6281 Muscle weakness (generalized): Secondary | ICD-10-CM | POA: Diagnosis not present

## 2017-12-03 DIAGNOSIS — F411 Generalized anxiety disorder: Secondary | ICD-10-CM | POA: Diagnosis not present

## 2017-12-06 DIAGNOSIS — F0281 Dementia in other diseases classified elsewhere with behavioral disturbance: Secondary | ICD-10-CM | POA: Diagnosis not present

## 2017-12-06 DIAGNOSIS — I63541 Cerebral infarction due to unspecified occlusion or stenosis of right cerebellar artery: Secondary | ICD-10-CM | POA: Diagnosis not present

## 2017-12-06 DIAGNOSIS — M6281 Muscle weakness (generalized): Secondary | ICD-10-CM | POA: Diagnosis not present

## 2017-12-06 DIAGNOSIS — G301 Alzheimer's disease with late onset: Secondary | ICD-10-CM | POA: Diagnosis not present

## 2017-12-06 DIAGNOSIS — R1312 Dysphagia, oropharyngeal phase: Secondary | ICD-10-CM | POA: Diagnosis not present

## 2017-12-06 DIAGNOSIS — D649 Anemia, unspecified: Secondary | ICD-10-CM | POA: Diagnosis not present

## 2017-12-07 DIAGNOSIS — I63541 Cerebral infarction due to unspecified occlusion or stenosis of right cerebellar artery: Secondary | ICD-10-CM | POA: Diagnosis not present

## 2017-12-07 DIAGNOSIS — M6281 Muscle weakness (generalized): Secondary | ICD-10-CM | POA: Diagnosis not present

## 2017-12-07 DIAGNOSIS — R1312 Dysphagia, oropharyngeal phase: Secondary | ICD-10-CM | POA: Diagnosis not present

## 2017-12-07 DIAGNOSIS — D508 Other iron deficiency anemias: Secondary | ICD-10-CM | POA: Diagnosis not present

## 2017-12-07 DIAGNOSIS — K219 Gastro-esophageal reflux disease without esophagitis: Secondary | ICD-10-CM | POA: Diagnosis not present

## 2017-12-07 DIAGNOSIS — F0281 Dementia in other diseases classified elsewhere with behavioral disturbance: Secondary | ICD-10-CM | POA: Diagnosis not present

## 2017-12-07 DIAGNOSIS — F321 Major depressive disorder, single episode, moderate: Secondary | ICD-10-CM | POA: Diagnosis not present

## 2017-12-07 DIAGNOSIS — I483 Typical atrial flutter: Secondary | ICD-10-CM | POA: Diagnosis not present

## 2017-12-07 DIAGNOSIS — I10 Essential (primary) hypertension: Secondary | ICD-10-CM | POA: Diagnosis not present

## 2017-12-07 DIAGNOSIS — F411 Generalized anxiety disorder: Secondary | ICD-10-CM | POA: Diagnosis not present

## 2017-12-07 DIAGNOSIS — F482 Pseudobulbar affect: Secondary | ICD-10-CM | POA: Diagnosis not present

## 2017-12-07 DIAGNOSIS — F0151 Vascular dementia with behavioral disturbance: Secondary | ICD-10-CM | POA: Diagnosis not present

## 2017-12-07 DIAGNOSIS — D649 Anemia, unspecified: Secondary | ICD-10-CM | POA: Diagnosis not present

## 2017-12-07 DIAGNOSIS — G301 Alzheimer's disease with late onset: Secondary | ICD-10-CM | POA: Diagnosis not present

## 2017-12-08 DIAGNOSIS — I63541 Cerebral infarction due to unspecified occlusion or stenosis of right cerebellar artery: Secondary | ICD-10-CM | POA: Diagnosis not present

## 2017-12-08 DIAGNOSIS — D649 Anemia, unspecified: Secondary | ICD-10-CM | POA: Diagnosis not present

## 2017-12-08 DIAGNOSIS — M6281 Muscle weakness (generalized): Secondary | ICD-10-CM | POA: Diagnosis not present

## 2017-12-08 DIAGNOSIS — F0281 Dementia in other diseases classified elsewhere with behavioral disturbance: Secondary | ICD-10-CM | POA: Diagnosis not present

## 2017-12-08 DIAGNOSIS — G301 Alzheimer's disease with late onset: Secondary | ICD-10-CM | POA: Diagnosis not present

## 2017-12-08 DIAGNOSIS — R1312 Dysphagia, oropharyngeal phase: Secondary | ICD-10-CM | POA: Diagnosis not present

## 2017-12-10 DIAGNOSIS — F0281 Dementia in other diseases classified elsewhere with behavioral disturbance: Secondary | ICD-10-CM | POA: Diagnosis not present

## 2017-12-10 DIAGNOSIS — R1312 Dysphagia, oropharyngeal phase: Secondary | ICD-10-CM | POA: Diagnosis not present

## 2017-12-10 DIAGNOSIS — I63541 Cerebral infarction due to unspecified occlusion or stenosis of right cerebellar artery: Secondary | ICD-10-CM | POA: Diagnosis not present

## 2017-12-10 DIAGNOSIS — D649 Anemia, unspecified: Secondary | ICD-10-CM | POA: Diagnosis not present

## 2017-12-10 DIAGNOSIS — R269 Unspecified abnormalities of gait and mobility: Secondary | ICD-10-CM | POA: Diagnosis not present

## 2017-12-10 DIAGNOSIS — M6281 Muscle weakness (generalized): Secondary | ICD-10-CM | POA: Diagnosis not present

## 2017-12-10 DIAGNOSIS — G301 Alzheimer's disease with late onset: Secondary | ICD-10-CM | POA: Diagnosis not present

## 2017-12-13 DIAGNOSIS — R1312 Dysphagia, oropharyngeal phase: Secondary | ICD-10-CM | POA: Diagnosis not present

## 2017-12-13 DIAGNOSIS — I63541 Cerebral infarction due to unspecified occlusion or stenosis of right cerebellar artery: Secondary | ICD-10-CM | POA: Diagnosis not present

## 2017-12-13 DIAGNOSIS — D649 Anemia, unspecified: Secondary | ICD-10-CM | POA: Diagnosis not present

## 2017-12-13 DIAGNOSIS — M6281 Muscle weakness (generalized): Secondary | ICD-10-CM | POA: Diagnosis not present

## 2017-12-13 DIAGNOSIS — F0281 Dementia in other diseases classified elsewhere with behavioral disturbance: Secondary | ICD-10-CM | POA: Diagnosis not present

## 2017-12-13 DIAGNOSIS — G301 Alzheimer's disease with late onset: Secondary | ICD-10-CM | POA: Diagnosis not present

## 2017-12-15 DIAGNOSIS — G301 Alzheimer's disease with late onset: Secondary | ICD-10-CM | POA: Diagnosis not present

## 2017-12-15 DIAGNOSIS — F0281 Dementia in other diseases classified elsewhere with behavioral disturbance: Secondary | ICD-10-CM | POA: Diagnosis not present

## 2017-12-15 DIAGNOSIS — R1312 Dysphagia, oropharyngeal phase: Secondary | ICD-10-CM | POA: Diagnosis not present

## 2017-12-15 DIAGNOSIS — I63541 Cerebral infarction due to unspecified occlusion or stenosis of right cerebellar artery: Secondary | ICD-10-CM | POA: Diagnosis not present

## 2017-12-15 DIAGNOSIS — D649 Anemia, unspecified: Secondary | ICD-10-CM | POA: Diagnosis not present

## 2017-12-15 DIAGNOSIS — M6281 Muscle weakness (generalized): Secondary | ICD-10-CM | POA: Diagnosis not present

## 2017-12-17 DIAGNOSIS — M6281 Muscle weakness (generalized): Secondary | ICD-10-CM | POA: Diagnosis not present

## 2017-12-17 DIAGNOSIS — F0281 Dementia in other diseases classified elsewhere with behavioral disturbance: Secondary | ICD-10-CM | POA: Diagnosis not present

## 2017-12-17 DIAGNOSIS — I63541 Cerebral infarction due to unspecified occlusion or stenosis of right cerebellar artery: Secondary | ICD-10-CM | POA: Diagnosis not present

## 2017-12-17 DIAGNOSIS — G301 Alzheimer's disease with late onset: Secondary | ICD-10-CM | POA: Diagnosis not present

## 2017-12-17 DIAGNOSIS — D649 Anemia, unspecified: Secondary | ICD-10-CM | POA: Diagnosis not present

## 2017-12-17 DIAGNOSIS — R1312 Dysphagia, oropharyngeal phase: Secondary | ICD-10-CM | POA: Diagnosis not present

## 2017-12-21 DIAGNOSIS — D649 Anemia, unspecified: Secondary | ICD-10-CM | POA: Diagnosis not present

## 2017-12-21 DIAGNOSIS — F0281 Dementia in other diseases classified elsewhere with behavioral disturbance: Secondary | ICD-10-CM | POA: Diagnosis not present

## 2017-12-21 DIAGNOSIS — F419 Anxiety disorder, unspecified: Secondary | ICD-10-CM | POA: Diagnosis not present

## 2017-12-21 DIAGNOSIS — I63541 Cerebral infarction due to unspecified occlusion or stenosis of right cerebellar artery: Secondary | ICD-10-CM | POA: Diagnosis not present

## 2017-12-21 DIAGNOSIS — G301 Alzheimer's disease with late onset: Secondary | ICD-10-CM | POA: Diagnosis not present

## 2017-12-21 DIAGNOSIS — M6281 Muscle weakness (generalized): Secondary | ICD-10-CM | POA: Diagnosis not present

## 2017-12-21 DIAGNOSIS — R1312 Dysphagia, oropharyngeal phase: Secondary | ICD-10-CM | POA: Diagnosis not present

## 2017-12-22 DIAGNOSIS — M6281 Muscle weakness (generalized): Secondary | ICD-10-CM | POA: Diagnosis not present

## 2017-12-22 DIAGNOSIS — D649 Anemia, unspecified: Secondary | ICD-10-CM | POA: Diagnosis not present

## 2017-12-22 DIAGNOSIS — F0281 Dementia in other diseases classified elsewhere with behavioral disturbance: Secondary | ICD-10-CM | POA: Diagnosis not present

## 2017-12-22 DIAGNOSIS — R1312 Dysphagia, oropharyngeal phase: Secondary | ICD-10-CM | POA: Diagnosis not present

## 2017-12-22 DIAGNOSIS — I63541 Cerebral infarction due to unspecified occlusion or stenosis of right cerebellar artery: Secondary | ICD-10-CM | POA: Diagnosis not present

## 2017-12-22 DIAGNOSIS — G301 Alzheimer's disease with late onset: Secondary | ICD-10-CM | POA: Diagnosis not present

## 2017-12-24 DIAGNOSIS — R1312 Dysphagia, oropharyngeal phase: Secondary | ICD-10-CM | POA: Diagnosis not present

## 2017-12-24 DIAGNOSIS — M6281 Muscle weakness (generalized): Secondary | ICD-10-CM | POA: Diagnosis not present

## 2017-12-24 DIAGNOSIS — F0281 Dementia in other diseases classified elsewhere with behavioral disturbance: Secondary | ICD-10-CM | POA: Diagnosis not present

## 2017-12-24 DIAGNOSIS — D649 Anemia, unspecified: Secondary | ICD-10-CM | POA: Diagnosis not present

## 2017-12-24 DIAGNOSIS — I63541 Cerebral infarction due to unspecified occlusion or stenosis of right cerebellar artery: Secondary | ICD-10-CM | POA: Diagnosis not present

## 2017-12-24 DIAGNOSIS — G301 Alzheimer's disease with late onset: Secondary | ICD-10-CM | POA: Diagnosis not present

## 2017-12-27 DIAGNOSIS — F0281 Dementia in other diseases classified elsewhere with behavioral disturbance: Secondary | ICD-10-CM | POA: Diagnosis not present

## 2017-12-27 DIAGNOSIS — F321 Major depressive disorder, single episode, moderate: Secondary | ICD-10-CM | POA: Diagnosis not present

## 2017-12-27 DIAGNOSIS — F411 Generalized anxiety disorder: Secondary | ICD-10-CM | POA: Diagnosis not present

## 2017-12-28 DIAGNOSIS — F0281 Dementia in other diseases classified elsewhere with behavioral disturbance: Secondary | ICD-10-CM | POA: Diagnosis not present

## 2017-12-28 DIAGNOSIS — I63541 Cerebral infarction due to unspecified occlusion or stenosis of right cerebellar artery: Secondary | ICD-10-CM | POA: Diagnosis not present

## 2017-12-28 DIAGNOSIS — G301 Alzheimer's disease with late onset: Secondary | ICD-10-CM | POA: Diagnosis not present

## 2017-12-28 DIAGNOSIS — R1312 Dysphagia, oropharyngeal phase: Secondary | ICD-10-CM | POA: Diagnosis not present

## 2017-12-28 DIAGNOSIS — D649 Anemia, unspecified: Secondary | ICD-10-CM | POA: Diagnosis not present

## 2017-12-28 DIAGNOSIS — M6281 Muscle weakness (generalized): Secondary | ICD-10-CM | POA: Diagnosis not present

## 2017-12-29 DIAGNOSIS — G301 Alzheimer's disease with late onset: Secondary | ICD-10-CM | POA: Diagnosis not present

## 2017-12-29 DIAGNOSIS — R1312 Dysphagia, oropharyngeal phase: Secondary | ICD-10-CM | POA: Diagnosis not present

## 2017-12-29 DIAGNOSIS — F0281 Dementia in other diseases classified elsewhere with behavioral disturbance: Secondary | ICD-10-CM | POA: Diagnosis not present

## 2017-12-29 DIAGNOSIS — I63541 Cerebral infarction due to unspecified occlusion or stenosis of right cerebellar artery: Secondary | ICD-10-CM | POA: Diagnosis not present

## 2017-12-29 DIAGNOSIS — M6281 Muscle weakness (generalized): Secondary | ICD-10-CM | POA: Diagnosis not present

## 2017-12-29 DIAGNOSIS — D649 Anemia, unspecified: Secondary | ICD-10-CM | POA: Diagnosis not present

## 2017-12-30 DIAGNOSIS — F0281 Dementia in other diseases classified elsewhere with behavioral disturbance: Secondary | ICD-10-CM | POA: Diagnosis not present

## 2017-12-30 DIAGNOSIS — R1312 Dysphagia, oropharyngeal phase: Secondary | ICD-10-CM | POA: Diagnosis not present

## 2017-12-30 DIAGNOSIS — D649 Anemia, unspecified: Secondary | ICD-10-CM | POA: Diagnosis not present

## 2017-12-30 DIAGNOSIS — M6281 Muscle weakness (generalized): Secondary | ICD-10-CM | POA: Diagnosis not present

## 2017-12-30 DIAGNOSIS — G301 Alzheimer's disease with late onset: Secondary | ICD-10-CM | POA: Diagnosis not present

## 2017-12-30 DIAGNOSIS — I63541 Cerebral infarction due to unspecified occlusion or stenosis of right cerebellar artery: Secondary | ICD-10-CM | POA: Diagnosis not present

## 2017-12-31 DIAGNOSIS — D649 Anemia, unspecified: Secondary | ICD-10-CM | POA: Diagnosis not present

## 2017-12-31 DIAGNOSIS — M6281 Muscle weakness (generalized): Secondary | ICD-10-CM | POA: Diagnosis not present

## 2017-12-31 DIAGNOSIS — R1312 Dysphagia, oropharyngeal phase: Secondary | ICD-10-CM | POA: Diagnosis not present

## 2017-12-31 DIAGNOSIS — G301 Alzheimer's disease with late onset: Secondary | ICD-10-CM | POA: Diagnosis not present

## 2017-12-31 DIAGNOSIS — I63541 Cerebral infarction due to unspecified occlusion or stenosis of right cerebellar artery: Secondary | ICD-10-CM | POA: Diagnosis not present

## 2017-12-31 DIAGNOSIS — F0281 Dementia in other diseases classified elsewhere with behavioral disturbance: Secondary | ICD-10-CM | POA: Diagnosis not present

## 2018-01-04 DIAGNOSIS — F419 Anxiety disorder, unspecified: Secondary | ICD-10-CM | POA: Diagnosis not present

## 2018-01-04 DIAGNOSIS — F0281 Dementia in other diseases classified elsewhere with behavioral disturbance: Secondary | ICD-10-CM | POA: Diagnosis not present

## 2018-01-04 DIAGNOSIS — I63541 Cerebral infarction due to unspecified occlusion or stenosis of right cerebellar artery: Secondary | ICD-10-CM | POA: Diagnosis not present

## 2018-01-04 DIAGNOSIS — R1312 Dysphagia, oropharyngeal phase: Secondary | ICD-10-CM | POA: Diagnosis not present

## 2018-01-04 DIAGNOSIS — D649 Anemia, unspecified: Secondary | ICD-10-CM | POA: Diagnosis not present

## 2018-01-04 DIAGNOSIS — G301 Alzheimer's disease with late onset: Secondary | ICD-10-CM | POA: Diagnosis not present

## 2018-01-04 DIAGNOSIS — R269 Unspecified abnormalities of gait and mobility: Secondary | ICD-10-CM | POA: Diagnosis not present

## 2018-01-04 DIAGNOSIS — M6281 Muscle weakness (generalized): Secondary | ICD-10-CM | POA: Diagnosis not present

## 2018-01-05 DIAGNOSIS — G301 Alzheimer's disease with late onset: Secondary | ICD-10-CM | POA: Diagnosis not present

## 2018-01-05 DIAGNOSIS — M6281 Muscle weakness (generalized): Secondary | ICD-10-CM | POA: Diagnosis not present

## 2018-01-05 DIAGNOSIS — D649 Anemia, unspecified: Secondary | ICD-10-CM | POA: Diagnosis not present

## 2018-01-05 DIAGNOSIS — I63541 Cerebral infarction due to unspecified occlusion or stenosis of right cerebellar artery: Secondary | ICD-10-CM | POA: Diagnosis not present

## 2018-01-05 DIAGNOSIS — F0281 Dementia in other diseases classified elsewhere with behavioral disturbance: Secondary | ICD-10-CM | POA: Diagnosis not present

## 2018-01-05 DIAGNOSIS — R1312 Dysphagia, oropharyngeal phase: Secondary | ICD-10-CM | POA: Diagnosis not present

## 2018-01-07 DIAGNOSIS — M6281 Muscle weakness (generalized): Secondary | ICD-10-CM | POA: Diagnosis not present

## 2018-01-07 DIAGNOSIS — K219 Gastro-esophageal reflux disease without esophagitis: Secondary | ICD-10-CM | POA: Diagnosis not present

## 2018-01-07 DIAGNOSIS — I63541 Cerebral infarction due to unspecified occlusion or stenosis of right cerebellar artery: Secondary | ICD-10-CM | POA: Diagnosis not present

## 2018-01-07 DIAGNOSIS — I483 Typical atrial flutter: Secondary | ICD-10-CM | POA: Diagnosis not present

## 2018-01-07 DIAGNOSIS — F482 Pseudobulbar affect: Secondary | ICD-10-CM | POA: Diagnosis not present

## 2018-01-07 DIAGNOSIS — D508 Other iron deficiency anemias: Secondary | ICD-10-CM | POA: Diagnosis not present

## 2018-01-07 DIAGNOSIS — F0151 Vascular dementia with behavioral disturbance: Secondary | ICD-10-CM | POA: Diagnosis not present

## 2018-01-07 DIAGNOSIS — F0281 Dementia in other diseases classified elsewhere with behavioral disturbance: Secondary | ICD-10-CM | POA: Diagnosis not present

## 2018-01-07 DIAGNOSIS — R1312 Dysphagia, oropharyngeal phase: Secondary | ICD-10-CM | POA: Diagnosis not present

## 2018-01-07 DIAGNOSIS — G301 Alzheimer's disease with late onset: Secondary | ICD-10-CM | POA: Diagnosis not present

## 2018-01-07 DIAGNOSIS — F411 Generalized anxiety disorder: Secondary | ICD-10-CM | POA: Diagnosis not present

## 2018-01-07 DIAGNOSIS — D649 Anemia, unspecified: Secondary | ICD-10-CM | POA: Diagnosis not present

## 2018-01-07 DIAGNOSIS — F321 Major depressive disorder, single episode, moderate: Secondary | ICD-10-CM | POA: Diagnosis not present

## 2018-01-07 DIAGNOSIS — I10 Essential (primary) hypertension: Secondary | ICD-10-CM | POA: Diagnosis not present

## 2018-01-10 DIAGNOSIS — M6281 Muscle weakness (generalized): Secondary | ICD-10-CM | POA: Diagnosis not present

## 2018-01-10 DIAGNOSIS — F0281 Dementia in other diseases classified elsewhere with behavioral disturbance: Secondary | ICD-10-CM | POA: Diagnosis not present

## 2018-01-10 DIAGNOSIS — I63541 Cerebral infarction due to unspecified occlusion or stenosis of right cerebellar artery: Secondary | ICD-10-CM | POA: Diagnosis not present

## 2018-01-10 DIAGNOSIS — D649 Anemia, unspecified: Secondary | ICD-10-CM | POA: Diagnosis not present

## 2018-01-10 DIAGNOSIS — G301 Alzheimer's disease with late onset: Secondary | ICD-10-CM | POA: Diagnosis not present

## 2018-01-10 DIAGNOSIS — R1312 Dysphagia, oropharyngeal phase: Secondary | ICD-10-CM | POA: Diagnosis not present

## 2018-01-12 DIAGNOSIS — F0281 Dementia in other diseases classified elsewhere with behavioral disturbance: Secondary | ICD-10-CM | POA: Diagnosis not present

## 2018-01-12 DIAGNOSIS — R1312 Dysphagia, oropharyngeal phase: Secondary | ICD-10-CM | POA: Diagnosis not present

## 2018-01-12 DIAGNOSIS — D649 Anemia, unspecified: Secondary | ICD-10-CM | POA: Diagnosis not present

## 2018-01-12 DIAGNOSIS — I63541 Cerebral infarction due to unspecified occlusion or stenosis of right cerebellar artery: Secondary | ICD-10-CM | POA: Diagnosis not present

## 2018-01-12 DIAGNOSIS — G301 Alzheimer's disease with late onset: Secondary | ICD-10-CM | POA: Diagnosis not present

## 2018-01-12 DIAGNOSIS — M6281 Muscle weakness (generalized): Secondary | ICD-10-CM | POA: Diagnosis not present

## 2018-01-14 DIAGNOSIS — D649 Anemia, unspecified: Secondary | ICD-10-CM | POA: Diagnosis not present

## 2018-01-14 DIAGNOSIS — F0281 Dementia in other diseases classified elsewhere with behavioral disturbance: Secondary | ICD-10-CM | POA: Diagnosis not present

## 2018-01-14 DIAGNOSIS — I63541 Cerebral infarction due to unspecified occlusion or stenosis of right cerebellar artery: Secondary | ICD-10-CM | POA: Diagnosis not present

## 2018-01-14 DIAGNOSIS — R1312 Dysphagia, oropharyngeal phase: Secondary | ICD-10-CM | POA: Diagnosis not present

## 2018-01-14 DIAGNOSIS — G301 Alzheimer's disease with late onset: Secondary | ICD-10-CM | POA: Diagnosis not present

## 2018-01-14 DIAGNOSIS — M6281 Muscle weakness (generalized): Secondary | ICD-10-CM | POA: Diagnosis not present

## 2018-01-17 DIAGNOSIS — R102 Pelvic and perineal pain: Secondary | ICD-10-CM | POA: Diagnosis not present

## 2018-01-17 DIAGNOSIS — M25551 Pain in right hip: Secondary | ICD-10-CM | POA: Diagnosis not present

## 2018-01-19 DIAGNOSIS — M6281 Muscle weakness (generalized): Secondary | ICD-10-CM | POA: Diagnosis not present

## 2018-01-19 DIAGNOSIS — I63541 Cerebral infarction due to unspecified occlusion or stenosis of right cerebellar artery: Secondary | ICD-10-CM | POA: Diagnosis not present

## 2018-01-19 DIAGNOSIS — F0281 Dementia in other diseases classified elsewhere with behavioral disturbance: Secondary | ICD-10-CM | POA: Diagnosis not present

## 2018-01-19 DIAGNOSIS — G301 Alzheimer's disease with late onset: Secondary | ICD-10-CM | POA: Diagnosis not present

## 2018-01-19 DIAGNOSIS — R1312 Dysphagia, oropharyngeal phase: Secondary | ICD-10-CM | POA: Diagnosis not present

## 2018-01-19 DIAGNOSIS — D649 Anemia, unspecified: Secondary | ICD-10-CM | POA: Diagnosis not present

## 2018-01-20 DIAGNOSIS — F0281 Dementia in other diseases classified elsewhere with behavioral disturbance: Secondary | ICD-10-CM | POA: Diagnosis not present

## 2018-01-20 DIAGNOSIS — G301 Alzheimer's disease with late onset: Secondary | ICD-10-CM | POA: Diagnosis not present

## 2018-01-20 DIAGNOSIS — D649 Anemia, unspecified: Secondary | ICD-10-CM | POA: Diagnosis not present

## 2018-01-20 DIAGNOSIS — R1312 Dysphagia, oropharyngeal phase: Secondary | ICD-10-CM | POA: Diagnosis not present

## 2018-01-20 DIAGNOSIS — I63541 Cerebral infarction due to unspecified occlusion or stenosis of right cerebellar artery: Secondary | ICD-10-CM | POA: Diagnosis not present

## 2018-01-20 DIAGNOSIS — M6281 Muscle weakness (generalized): Secondary | ICD-10-CM | POA: Diagnosis not present

## 2018-01-21 DIAGNOSIS — F0281 Dementia in other diseases classified elsewhere with behavioral disturbance: Secondary | ICD-10-CM | POA: Diagnosis not present

## 2018-01-21 DIAGNOSIS — R1312 Dysphagia, oropharyngeal phase: Secondary | ICD-10-CM | POA: Diagnosis not present

## 2018-01-21 DIAGNOSIS — I63541 Cerebral infarction due to unspecified occlusion or stenosis of right cerebellar artery: Secondary | ICD-10-CM | POA: Diagnosis not present

## 2018-01-21 DIAGNOSIS — M6281 Muscle weakness (generalized): Secondary | ICD-10-CM | POA: Diagnosis not present

## 2018-01-21 DIAGNOSIS — D649 Anemia, unspecified: Secondary | ICD-10-CM | POA: Diagnosis not present

## 2018-01-21 DIAGNOSIS — G301 Alzheimer's disease with late onset: Secondary | ICD-10-CM | POA: Diagnosis not present

## 2018-01-25 DIAGNOSIS — F0281 Dementia in other diseases classified elsewhere with behavioral disturbance: Secondary | ICD-10-CM | POA: Diagnosis not present

## 2018-01-25 DIAGNOSIS — G301 Alzheimer's disease with late onset: Secondary | ICD-10-CM | POA: Diagnosis not present

## 2018-01-25 DIAGNOSIS — M6281 Muscle weakness (generalized): Secondary | ICD-10-CM | POA: Diagnosis not present

## 2018-01-25 DIAGNOSIS — D649 Anemia, unspecified: Secondary | ICD-10-CM | POA: Diagnosis not present

## 2018-01-25 DIAGNOSIS — I63541 Cerebral infarction due to unspecified occlusion or stenosis of right cerebellar artery: Secondary | ICD-10-CM | POA: Diagnosis not present

## 2018-01-25 DIAGNOSIS — R1312 Dysphagia, oropharyngeal phase: Secondary | ICD-10-CM | POA: Diagnosis not present

## 2018-01-26 DIAGNOSIS — R1312 Dysphagia, oropharyngeal phase: Secondary | ICD-10-CM | POA: Diagnosis not present

## 2018-01-26 DIAGNOSIS — F0281 Dementia in other diseases classified elsewhere with behavioral disturbance: Secondary | ICD-10-CM | POA: Diagnosis not present

## 2018-01-26 DIAGNOSIS — M6281 Muscle weakness (generalized): Secondary | ICD-10-CM | POA: Diagnosis not present

## 2018-01-26 DIAGNOSIS — D649 Anemia, unspecified: Secondary | ICD-10-CM | POA: Diagnosis not present

## 2018-01-26 DIAGNOSIS — I63541 Cerebral infarction due to unspecified occlusion or stenosis of right cerebellar artery: Secondary | ICD-10-CM | POA: Diagnosis not present

## 2018-01-26 DIAGNOSIS — G301 Alzheimer's disease with late onset: Secondary | ICD-10-CM | POA: Diagnosis not present

## 2018-01-28 DIAGNOSIS — D649 Anemia, unspecified: Secondary | ICD-10-CM | POA: Diagnosis not present

## 2018-01-28 DIAGNOSIS — I63541 Cerebral infarction due to unspecified occlusion or stenosis of right cerebellar artery: Secondary | ICD-10-CM | POA: Diagnosis not present

## 2018-01-28 DIAGNOSIS — M6281 Muscle weakness (generalized): Secondary | ICD-10-CM | POA: Diagnosis not present

## 2018-01-28 DIAGNOSIS — R1312 Dysphagia, oropharyngeal phase: Secondary | ICD-10-CM | POA: Diagnosis not present

## 2018-01-28 DIAGNOSIS — F0281 Dementia in other diseases classified elsewhere with behavioral disturbance: Secondary | ICD-10-CM | POA: Diagnosis not present

## 2018-01-28 DIAGNOSIS — G301 Alzheimer's disease with late onset: Secondary | ICD-10-CM | POA: Diagnosis not present

## 2018-01-31 DIAGNOSIS — E039 Hypothyroidism, unspecified: Secondary | ICD-10-CM | POA: Diagnosis not present

## 2018-01-31 DIAGNOSIS — F0281 Dementia in other diseases classified elsewhere with behavioral disturbance: Secondary | ICD-10-CM | POA: Diagnosis not present

## 2018-01-31 DIAGNOSIS — F419 Anxiety disorder, unspecified: Secondary | ICD-10-CM | POA: Diagnosis not present

## 2018-01-31 DIAGNOSIS — K219 Gastro-esophageal reflux disease without esophagitis: Secondary | ICD-10-CM | POA: Diagnosis not present

## 2018-01-31 DIAGNOSIS — R1312 Dysphagia, oropharyngeal phase: Secondary | ICD-10-CM | POA: Diagnosis not present

## 2018-01-31 DIAGNOSIS — I63541 Cerebral infarction due to unspecified occlusion or stenosis of right cerebellar artery: Secondary | ICD-10-CM | POA: Diagnosis not present

## 2018-01-31 DIAGNOSIS — G301 Alzheimer's disease with late onset: Secondary | ICD-10-CM | POA: Diagnosis not present

## 2018-01-31 DIAGNOSIS — M6281 Muscle weakness (generalized): Secondary | ICD-10-CM | POA: Diagnosis not present

## 2018-01-31 DIAGNOSIS — D649 Anemia, unspecified: Secondary | ICD-10-CM | POA: Diagnosis not present

## 2018-02-01 DIAGNOSIS — R1312 Dysphagia, oropharyngeal phase: Secondary | ICD-10-CM | POA: Diagnosis not present

## 2018-02-01 DIAGNOSIS — F0281 Dementia in other diseases classified elsewhere with behavioral disturbance: Secondary | ICD-10-CM | POA: Diagnosis not present

## 2018-02-01 DIAGNOSIS — M6281 Muscle weakness (generalized): Secondary | ICD-10-CM | POA: Diagnosis not present

## 2018-02-01 DIAGNOSIS — G301 Alzheimer's disease with late onset: Secondary | ICD-10-CM | POA: Diagnosis not present

## 2018-02-01 DIAGNOSIS — I63541 Cerebral infarction due to unspecified occlusion or stenosis of right cerebellar artery: Secondary | ICD-10-CM | POA: Diagnosis not present

## 2018-02-01 DIAGNOSIS — D649 Anemia, unspecified: Secondary | ICD-10-CM | POA: Diagnosis not present

## 2018-02-02 DIAGNOSIS — F0281 Dementia in other diseases classified elsewhere with behavioral disturbance: Secondary | ICD-10-CM | POA: Diagnosis not present

## 2018-02-02 DIAGNOSIS — M6281 Muscle weakness (generalized): Secondary | ICD-10-CM | POA: Diagnosis not present

## 2018-02-02 DIAGNOSIS — D649 Anemia, unspecified: Secondary | ICD-10-CM | POA: Diagnosis not present

## 2018-02-02 DIAGNOSIS — I63541 Cerebral infarction due to unspecified occlusion or stenosis of right cerebellar artery: Secondary | ICD-10-CM | POA: Diagnosis not present

## 2018-02-02 DIAGNOSIS — R1312 Dysphagia, oropharyngeal phase: Secondary | ICD-10-CM | POA: Diagnosis not present

## 2018-02-02 DIAGNOSIS — G301 Alzheimer's disease with late onset: Secondary | ICD-10-CM | POA: Diagnosis not present

## 2018-02-04 DIAGNOSIS — G301 Alzheimer's disease with late onset: Secondary | ICD-10-CM | POA: Diagnosis not present

## 2018-02-04 DIAGNOSIS — F0281 Dementia in other diseases classified elsewhere with behavioral disturbance: Secondary | ICD-10-CM | POA: Diagnosis not present

## 2018-02-04 DIAGNOSIS — I63541 Cerebral infarction due to unspecified occlusion or stenosis of right cerebellar artery: Secondary | ICD-10-CM | POA: Diagnosis not present

## 2018-02-04 DIAGNOSIS — R1312 Dysphagia, oropharyngeal phase: Secondary | ICD-10-CM | POA: Diagnosis not present

## 2018-02-04 DIAGNOSIS — M6281 Muscle weakness (generalized): Secondary | ICD-10-CM | POA: Diagnosis not present

## 2018-02-04 DIAGNOSIS — D649 Anemia, unspecified: Secondary | ICD-10-CM | POA: Diagnosis not present

## 2018-02-06 DIAGNOSIS — I63541 Cerebral infarction due to unspecified occlusion or stenosis of right cerebellar artery: Secondary | ICD-10-CM | POA: Diagnosis not present

## 2018-02-06 DIAGNOSIS — D508 Other iron deficiency anemias: Secondary | ICD-10-CM | POA: Diagnosis not present

## 2018-02-06 DIAGNOSIS — G301 Alzheimer's disease with late onset: Secondary | ICD-10-CM | POA: Diagnosis not present

## 2018-02-06 DIAGNOSIS — D649 Anemia, unspecified: Secondary | ICD-10-CM | POA: Diagnosis not present

## 2018-02-06 DIAGNOSIS — I10 Essential (primary) hypertension: Secondary | ICD-10-CM | POA: Diagnosis not present

## 2018-02-06 DIAGNOSIS — F321 Major depressive disorder, single episode, moderate: Secondary | ICD-10-CM | POA: Diagnosis not present

## 2018-02-06 DIAGNOSIS — F0151 Vascular dementia with behavioral disturbance: Secondary | ICD-10-CM | POA: Diagnosis not present

## 2018-02-06 DIAGNOSIS — K219 Gastro-esophageal reflux disease without esophagitis: Secondary | ICD-10-CM | POA: Diagnosis not present

## 2018-02-06 DIAGNOSIS — F411 Generalized anxiety disorder: Secondary | ICD-10-CM | POA: Diagnosis not present

## 2018-02-06 DIAGNOSIS — I483 Typical atrial flutter: Secondary | ICD-10-CM | POA: Diagnosis not present

## 2018-02-06 DIAGNOSIS — F482 Pseudobulbar affect: Secondary | ICD-10-CM | POA: Diagnosis not present

## 2018-02-06 DIAGNOSIS — F0281 Dementia in other diseases classified elsewhere with behavioral disturbance: Secondary | ICD-10-CM | POA: Diagnosis not present

## 2018-02-06 DIAGNOSIS — R1312 Dysphagia, oropharyngeal phase: Secondary | ICD-10-CM | POA: Diagnosis not present

## 2018-02-06 DIAGNOSIS — M6281 Muscle weakness (generalized): Secondary | ICD-10-CM | POA: Diagnosis not present

## 2018-02-07 DIAGNOSIS — I63541 Cerebral infarction due to unspecified occlusion or stenosis of right cerebellar artery: Secondary | ICD-10-CM | POA: Diagnosis not present

## 2018-02-07 DIAGNOSIS — F411 Generalized anxiety disorder: Secondary | ICD-10-CM | POA: Diagnosis not present

## 2018-02-07 DIAGNOSIS — D649 Anemia, unspecified: Secondary | ICD-10-CM | POA: Diagnosis not present

## 2018-02-07 DIAGNOSIS — G301 Alzheimer's disease with late onset: Secondary | ICD-10-CM | POA: Diagnosis not present

## 2018-02-07 DIAGNOSIS — F321 Major depressive disorder, single episode, moderate: Secondary | ICD-10-CM | POA: Diagnosis not present

## 2018-02-07 DIAGNOSIS — R1312 Dysphagia, oropharyngeal phase: Secondary | ICD-10-CM | POA: Diagnosis not present

## 2018-02-07 DIAGNOSIS — M6281 Muscle weakness (generalized): Secondary | ICD-10-CM | POA: Diagnosis not present

## 2018-02-07 DIAGNOSIS — F0281 Dementia in other diseases classified elsewhere with behavioral disturbance: Secondary | ICD-10-CM | POA: Diagnosis not present

## 2018-02-09 DIAGNOSIS — M6281 Muscle weakness (generalized): Secondary | ICD-10-CM | POA: Diagnosis not present

## 2018-02-09 DIAGNOSIS — G301 Alzheimer's disease with late onset: Secondary | ICD-10-CM | POA: Diagnosis not present

## 2018-02-09 DIAGNOSIS — F0281 Dementia in other diseases classified elsewhere with behavioral disturbance: Secondary | ICD-10-CM | POA: Diagnosis not present

## 2018-02-09 DIAGNOSIS — R1312 Dysphagia, oropharyngeal phase: Secondary | ICD-10-CM | POA: Diagnosis not present

## 2018-02-09 DIAGNOSIS — I63541 Cerebral infarction due to unspecified occlusion or stenosis of right cerebellar artery: Secondary | ICD-10-CM | POA: Diagnosis not present

## 2018-02-09 DIAGNOSIS — D649 Anemia, unspecified: Secondary | ICD-10-CM | POA: Diagnosis not present

## 2018-02-11 DIAGNOSIS — R1312 Dysphagia, oropharyngeal phase: Secondary | ICD-10-CM | POA: Diagnosis not present

## 2018-02-11 DIAGNOSIS — M6281 Muscle weakness (generalized): Secondary | ICD-10-CM | POA: Diagnosis not present

## 2018-02-11 DIAGNOSIS — F0281 Dementia in other diseases classified elsewhere with behavioral disturbance: Secondary | ICD-10-CM | POA: Diagnosis not present

## 2018-02-11 DIAGNOSIS — G301 Alzheimer's disease with late onset: Secondary | ICD-10-CM | POA: Diagnosis not present

## 2018-02-11 DIAGNOSIS — I63541 Cerebral infarction due to unspecified occlusion or stenosis of right cerebellar artery: Secondary | ICD-10-CM | POA: Diagnosis not present

## 2018-02-11 DIAGNOSIS — D649 Anemia, unspecified: Secondary | ICD-10-CM | POA: Diagnosis not present

## 2018-02-14 DIAGNOSIS — I63541 Cerebral infarction due to unspecified occlusion or stenosis of right cerebellar artery: Secondary | ICD-10-CM | POA: Diagnosis not present

## 2018-02-14 DIAGNOSIS — D649 Anemia, unspecified: Secondary | ICD-10-CM | POA: Diagnosis not present

## 2018-02-14 DIAGNOSIS — M6281 Muscle weakness (generalized): Secondary | ICD-10-CM | POA: Diagnosis not present

## 2018-02-14 DIAGNOSIS — G301 Alzheimer's disease with late onset: Secondary | ICD-10-CM | POA: Diagnosis not present

## 2018-02-14 DIAGNOSIS — F0281 Dementia in other diseases classified elsewhere with behavioral disturbance: Secondary | ICD-10-CM | POA: Diagnosis not present

## 2018-02-14 DIAGNOSIS — R1312 Dysphagia, oropharyngeal phase: Secondary | ICD-10-CM | POA: Diagnosis not present

## 2018-02-15 DIAGNOSIS — M25511 Pain in right shoulder: Secondary | ICD-10-CM | POA: Diagnosis not present

## 2018-02-15 DIAGNOSIS — M25521 Pain in right elbow: Secondary | ICD-10-CM | POA: Diagnosis not present

## 2018-02-15 DIAGNOSIS — M79631 Pain in right forearm: Secondary | ICD-10-CM | POA: Diagnosis not present

## 2018-02-15 DIAGNOSIS — G301 Alzheimer's disease with late onset: Secondary | ICD-10-CM | POA: Diagnosis not present

## 2018-02-15 DIAGNOSIS — M79601 Pain in right arm: Secondary | ICD-10-CM | POA: Diagnosis not present

## 2018-02-15 DIAGNOSIS — S42001A Fracture of unspecified part of right clavicle, initial encounter for closed fracture: Secondary | ICD-10-CM | POA: Diagnosis not present

## 2018-02-16 DIAGNOSIS — I63541 Cerebral infarction due to unspecified occlusion or stenosis of right cerebellar artery: Secondary | ICD-10-CM | POA: Diagnosis not present

## 2018-02-16 DIAGNOSIS — D649 Anemia, unspecified: Secondary | ICD-10-CM | POA: Diagnosis not present

## 2018-02-16 DIAGNOSIS — G301 Alzheimer's disease with late onset: Secondary | ICD-10-CM | POA: Diagnosis not present

## 2018-02-16 DIAGNOSIS — F0281 Dementia in other diseases classified elsewhere with behavioral disturbance: Secondary | ICD-10-CM | POA: Diagnosis not present

## 2018-02-16 DIAGNOSIS — M6281 Muscle weakness (generalized): Secondary | ICD-10-CM | POA: Diagnosis not present

## 2018-02-16 DIAGNOSIS — R1312 Dysphagia, oropharyngeal phase: Secondary | ICD-10-CM | POA: Diagnosis not present

## 2018-02-18 DIAGNOSIS — F0281 Dementia in other diseases classified elsewhere with behavioral disturbance: Secondary | ICD-10-CM | POA: Diagnosis not present

## 2018-02-18 DIAGNOSIS — I63541 Cerebral infarction due to unspecified occlusion or stenosis of right cerebellar artery: Secondary | ICD-10-CM | POA: Diagnosis not present

## 2018-02-18 DIAGNOSIS — G301 Alzheimer's disease with late onset: Secondary | ICD-10-CM | POA: Diagnosis not present

## 2018-02-18 DIAGNOSIS — R1312 Dysphagia, oropharyngeal phase: Secondary | ICD-10-CM | POA: Diagnosis not present

## 2018-02-18 DIAGNOSIS — M6281 Muscle weakness (generalized): Secondary | ICD-10-CM | POA: Diagnosis not present

## 2018-02-18 DIAGNOSIS — D649 Anemia, unspecified: Secondary | ICD-10-CM | POA: Diagnosis not present

## 2018-02-21 DIAGNOSIS — M6281 Muscle weakness (generalized): Secondary | ICD-10-CM | POA: Diagnosis not present

## 2018-02-21 DIAGNOSIS — G301 Alzheimer's disease with late onset: Secondary | ICD-10-CM | POA: Diagnosis not present

## 2018-02-21 DIAGNOSIS — F0281 Dementia in other diseases classified elsewhere with behavioral disturbance: Secondary | ICD-10-CM | POA: Diagnosis not present

## 2018-02-21 DIAGNOSIS — R1312 Dysphagia, oropharyngeal phase: Secondary | ICD-10-CM | POA: Diagnosis not present

## 2018-02-21 DIAGNOSIS — D649 Anemia, unspecified: Secondary | ICD-10-CM | POA: Diagnosis not present

## 2018-02-21 DIAGNOSIS — I63541 Cerebral infarction due to unspecified occlusion or stenosis of right cerebellar artery: Secondary | ICD-10-CM | POA: Diagnosis not present

## 2018-02-22 DIAGNOSIS — M6281 Muscle weakness (generalized): Secondary | ICD-10-CM | POA: Diagnosis not present

## 2018-02-22 DIAGNOSIS — I63541 Cerebral infarction due to unspecified occlusion or stenosis of right cerebellar artery: Secondary | ICD-10-CM | POA: Diagnosis not present

## 2018-02-22 DIAGNOSIS — G301 Alzheimer's disease with late onset: Secondary | ICD-10-CM | POA: Diagnosis not present

## 2018-02-22 DIAGNOSIS — F0281 Dementia in other diseases classified elsewhere with behavioral disturbance: Secondary | ICD-10-CM | POA: Diagnosis not present

## 2018-02-22 DIAGNOSIS — R1312 Dysphagia, oropharyngeal phase: Secondary | ICD-10-CM | POA: Diagnosis not present

## 2018-02-22 DIAGNOSIS — D649 Anemia, unspecified: Secondary | ICD-10-CM | POA: Diagnosis not present

## 2018-02-23 DIAGNOSIS — F419 Anxiety disorder, unspecified: Secondary | ICD-10-CM | POA: Diagnosis not present

## 2018-02-23 DIAGNOSIS — F0281 Dementia in other diseases classified elsewhere with behavioral disturbance: Secondary | ICD-10-CM | POA: Diagnosis not present

## 2018-02-23 DIAGNOSIS — R1312 Dysphagia, oropharyngeal phase: Secondary | ICD-10-CM | POA: Diagnosis not present

## 2018-02-23 DIAGNOSIS — S42001A Fracture of unspecified part of right clavicle, initial encounter for closed fracture: Secondary | ICD-10-CM | POA: Diagnosis not present

## 2018-02-23 DIAGNOSIS — M6281 Muscle weakness (generalized): Secondary | ICD-10-CM | POA: Diagnosis not present

## 2018-02-23 DIAGNOSIS — D649 Anemia, unspecified: Secondary | ICD-10-CM | POA: Diagnosis not present

## 2018-02-23 DIAGNOSIS — I63541 Cerebral infarction due to unspecified occlusion or stenosis of right cerebellar artery: Secondary | ICD-10-CM | POA: Diagnosis not present

## 2018-02-23 DIAGNOSIS — G301 Alzheimer's disease with late onset: Secondary | ICD-10-CM | POA: Diagnosis not present

## 2018-02-23 DIAGNOSIS — R630 Anorexia: Secondary | ICD-10-CM | POA: Diagnosis not present

## 2018-03-09 DEATH — deceased
